# Patient Record
Sex: Male | Born: 1975
Health system: Southern US, Community
[De-identification: ages and names within clinical notes are randomized; demographics above are authoritative.]

## PROBLEM LIST (undated history)

## (undated) DIAGNOSIS — I4892 Unspecified atrial flutter: Secondary | ICD-10-CM

## (undated) DIAGNOSIS — L0390 Cellulitis, unspecified: Secondary | ICD-10-CM

## (undated) DIAGNOSIS — I4891 Unspecified atrial fibrillation: Secondary | ICD-10-CM

## (undated) HISTORY — PX: OTHER SURGICAL HISTORY: SHX169

---

## 2019-06-18 ENCOUNTER — Other Ambulatory Visit: Payer: Self-pay

## 2019-06-18 ENCOUNTER — Telehealth (HOSPITAL_COMMUNITY): Payer: Self-pay | Admitting: *Deleted

## 2019-06-18 ENCOUNTER — Emergency Department (HOSPITAL_COMMUNITY)
Admission: EM | Admit: 2019-06-18 | Discharge: 2019-06-18 | Disposition: A | Discharge status: Home / Self Care | Payer: 59 | Attending: Emergency Medicine | Admitting: Emergency Medicine

## 2019-06-18 ENCOUNTER — Encounter (HOSPITAL_COMMUNITY): Payer: Self-pay | Admitting: Emergency Medicine

## 2019-06-18 DIAGNOSIS — F17210 Nicotine dependence, cigarettes, uncomplicated: Secondary | ICD-10-CM | POA: Insufficient documentation

## 2019-06-18 DIAGNOSIS — I483 Typical atrial flutter: Secondary | ICD-10-CM | POA: Diagnosis not present

## 2019-06-18 DIAGNOSIS — R002 Palpitations: Secondary | ICD-10-CM | POA: Diagnosis present

## 2019-06-18 HISTORY — DX: Unspecified atrial flutter: I48.92

## 2019-06-18 LAB — BASIC METABOLIC PANEL
Anion gap: 9 (ref 5–15)
BUN: 17 mg/dL (ref 6–20)
CO2: 22 mmol/L (ref 22–32)
Calcium: 9.2 mg/dL (ref 8.9–10.3)
Chloride: 106 mmol/L (ref 98–111)
Creatinine, Ser: 0.91 mg/dL (ref 0.61–1.24)
GFR calc Af Amer: >60 mL/min (ref >60)
GFR calc non Af Amer: >60 mL/min (ref >60)
Glucose, Bld: 107 mg/dL — ABNORMAL HIGH (ref 70–99)
Potassium: 4 mmol/L (ref 3.5–5.1)
Sodium: 137 mmol/L (ref 135–145)

## 2019-06-18 LAB — CBC
HCT: 41.9 % (ref 39.0–52.0)
Hemoglobin: 14.2 g/dL (ref 13.0–17.0)
MCH: 33.2 pg (ref 26.0–34.0)
MCHC: 33.9 g/dL (ref 30.0–36.0)
MCV: 97.9 fL (ref 80.0–100.0)
Platelets: 144 10*3/uL — ABNORMAL LOW (ref 150–400)
RBC: 4.28 MIL/uL (ref 4.22–5.81)
RDW: 13.2 % (ref 11.5–15.5)
WBC: 6.4 10*3/uL (ref 4.0–10.5)
nRBC: 0 % (ref 0.0–0.2)

## 2019-06-18 LAB — MAGNESIUM: Magnesium: 1.9 mg/dL (ref 1.7–2.4)

## 2019-06-18 MED ORDER — APIXABAN 5 MG PO TABS: 5.0000 mg | ORAL_TABLET | Freq: Two times a day (BID) | ORAL | 0 refills | Status: DC

## 2019-06-18 MED ORDER — PROPOFOL 10 MG/ML IV BOLUS
INTRAVENOUS | Status: AC | PRN
  Administered 2019-06-18 (×2): 50 mg via INTRAVENOUS

## 2019-06-18 MED ORDER — PROPOFOL 10 MG/ML IV BOLUS
50.0000 mg | Freq: Once | INTRAVENOUS | Status: DC
  Filled 2019-06-18: qty 20

## 2019-06-18 MED ORDER — APIXABAN 5 MG PO TABS
5.0000 mg | ORAL_TABLET | Freq: Two times a day (BID) | ORAL | Status: DC
  Administered 2019-06-18: 5 mg via ORAL
  Filled 2019-06-18: qty 1

## 2019-06-18 MED FILL — ELIQUIS 5 MG TABLET: 5 | 30 days supply | Qty: 60 | Fill #0

## 2019-06-18 NOTE — ED Notes (Signed)
Patient placed on zoll pads, suction and ambu bag set up at bedside, airway cart placed outside of room. RRT at bedside in preparation for cardioversion with conscious sedation

## 2019-06-18 NOTE — ED Triage Notes (Addendum)
Patient reports "fluttering" in his chest and sob that began while he was at work yesterday. Hx of a flutter and was recently taken off of all his meds when he moved here a few months ago. Pt denies chest pain, resp e/u, skin warm and dry, nad. Pt denies cough, fever, chills, body aches, sore throat or recent sick contacts.

## 2019-06-18 NOTE — Telephone Encounter (Signed)
LMOM for pt to [facility].  Pt dccv in ED and referred to our clinic.

## 2019-06-18 NOTE — ED Provider Notes (Signed)
[name] Provider Note   CSN: [id number] Arrival date & time: 06/18/19  0555     History   Chief Complaint Chief Complaint  Patient presents with  . Palpitations  . Shortness of Breath    HPI [name] is a 43 y.o. male.     The history is provided by the patient.  Palpitations Palpitations quality:  Irregular Onset quality:  Sudden Duration:  18 hours Timing:  Constant Progression:  Unchanged Chronicity:  New Relieved by:  None tried Worsened by:  Nothing Associated symptoms: shortness of breath   Associated symptoms: no chest pain, no syncope and no vomiting   Shortness of Breath Associated symptoms: no chest pain, no fever, no syncope and no vomiting    Patient with history of atrial flutter presents with palpitations.  He reports in the past 18 hours he had onset of palpitations.  He was at work when this started.  He reports mild shortness of breath.  No chest pain.  No syncope.  Patient reports when he lived in [place] he had similar episode and required cardioversion.  He has been on amiodarone and Eliquis previously, but is not currently on these medicines.  He is established with a cardiologist in [place], but has not been placed back on those medications .  No bleeding issues when taking Eliquis previously Past Medical History:  Diagnosis Date  . Atrial flutter (HCC)     There are no active problems to display for this patient.   Past Surgical History:  Procedure Laterality Date  . arm surgery Left         Home Medications    Prior to Admission medications   Not on File    Family History No family history on file.  Social History Social History   Tobacco Use  . Smoking status: Current Every Day Smoker    Packs/day: 0.50    Types: Cigarettes  Substance Use Topics  . Alcohol use: Not Currently    Frequency: Never  . Drug use: Never     Allergies   Patient has no known allergies.    Review of Systems Review of Systems  Constitutional: Negative for fever.  Respiratory: Positive for shortness of breath.   Cardiovascular: Positive for palpitations. Negative for chest pain and syncope.  Gastrointestinal: Negative for blood in stool and vomiting.  Neurological: Negative for syncope.  All other systems reviewed and are negative.    Physical Exam Updated Vital Signs BP 115/79   Pulse 69   Temp 97.7 F (36.5 C) (Oral)   Resp 15   SpO2 99%   Physical Exam CONSTITUTIONAL: Well developed/well nourished, no distress HEAD: Normocephalic/atraumatic EYES: EOMI/PERRL ENMT: Mucous membranes moist, uvula midline NECK: supple no meningeal signs SPINE/BACK:entire spine nontender CV: irregular, no loud murmurs LUNGS: Lungs are clear to auscultation bilaterally, no apparent distress ABDOMEN: soft, nontender, no rebound or guarding, bowel sounds noted throughout abdomen GU:no cva tenderness NEURO: Pt is awake/alert/appropriate, moves all extremitiesx4.  No facial droop.   EXTREMITIES: pulses normal/equal, full ROM SKIN: warm, color normal PSYCH: no abnormalities of mood noted, alert and oriented to situation   ED Treatments / Results  Labs (all labs ordered are listed, but only abnormal results are displayed) Labs Reviewed  CBC - Abnormal; Notable for the following components:      Result Value   Platelets 144 (*)    All other components within normal limits  BASIC METABOLIC PANEL  MAGNESIUM  EKG EKG Interpretation  Date/Time:  Friday June 18 2019 06:07:47 EDT Ventricular Rate:  73 PR Interval:    QRS Duration: 99 QT Interval:  460 QTC Calculation: 486 R Axis:   89 Text Interpretation:  Atrial flutter Interpretation limited secondary to artifact No previous ECGs available Confirmed by Ripley Fraise [phone]) on 06/18/2019 6:10:34 AM   Radiology No results found.  Procedures .Sedation  Date/Time: 06/18/2019 6:35 AM Performed by: Ripley Fraise, MD Authorized by: Ripley Fraise, MD   Consent:    Consent obtained:  Written   Consent given by:  Patient Universal protocol:    Immediately prior to procedure a time out was called: yes     Patient identity confirmation method:  Arm band, provided demographic data and verbally with patient Indications:    Procedure performed:  Cardioversion Pre-sedation assessment:    Time since last food or drink:  8 hours   ASA classification: class 2 - patient with mild systemic disease     Neck mobility: normal     Mallampati score:  I - soft palate, uvula, fauces, pillars visible   Pre-sedation assessments completed and reviewed: airway patency, cardiovascular function and mental status     Pre-sedation assessment completed:  06/18/2019 6:35 AM Immediate pre-procedure details:    Reassessment: Patient reassessed immediately prior to procedure     Reviewed: vital signs, relevant labs/tests and NPO status     Verified: bag valve mask available, emergency equipment available, IV patency confirmed and oxygen available   Procedure details (see MAR for exact dosages):    Preoxygenation:  Nasal cannula   Sedation:  Propofol   Intra-procedure monitoring:  Blood pressure monitoring, cardiac monitor, continuous capnometry, continuous pulse oximetry, frequent LOC assessments and frequent vital sign checks   Intra-procedure events: none     Total Provider sedation time (minutes):  18 Post-procedure details:    Post-sedation assessment completed:  06/18/2019 7:04 AM   Attendance: Constant attendance by certified staff until patient recovered     Recovery: Patient returned to pre-procedure baseline     Post-sedation assessments completed and reviewed: airway patency and cardiovascular function     Patient is stable for discharge or admission: yes     Patient tolerance:  Tolerated well, no immediate complications .Cardioversion  Date/Time: 06/18/2019 6:36 AM Performed by: Ripley Fraise, MD  Authorized by: Ripley Fraise, MD   Consent:    Consent given by:  Patient   Risks discussed:  Induced arrhythmia and pain   Alternatives discussed:  No treatment and anti-coagulation medication Pre-procedure details:    Cardioversion basis:  Emergent   Rhythm:  Atrial flutter   Electrode placement:  Anterior-posterior Patient sedated: Yes. Refer to sedation procedure documentation for details of sedation.  Attempt one:    Cardioversion mode:  Synchronous   Shock (Joules):  100   Shock outcome:  Conversion to normal sinus rhythm Post-procedure details:    Patient status:  Awake   Patient tolerance of procedure:  Tolerated well, no immediate complications    Medications Ordered in ED Medications  apixaban (ELIQUIS) tablet 5 mg (has no administration in time range)  propofol (DIPRIVAN) 10 mg/mL bolus/IV push 50 mg ( Intravenous See Procedure Record 06/18/19 0642)  propofol (DIPRIVAN) 10 mg/mL bolus/IV push (50 mg Intravenous Given 06/18/19 0932)     Initial Impression / Assessment and Plan / ED Course  I have reviewed the triage vital signs and the nursing notes.  Pertinent labs  results that were available during my  care of the patient were reviewed by me and considered in my medical decision making (see chart for details).        6:36 AM Patient with previous history of atrial flutter presents with recurrent episode.  He is no longer on medications.  After discussion of risk and benefit, patient agrees to have cardioversion. Patient was n.p.o., onset of a-flutter was less than 24 hours  6:53 AM Post cardioverson EKG :   EKG Interpretation  Date/Time:  Friday June 18 2019 06:43:57 EDT Ventricular Rate:  60 PR Interval:    QRS Duration: 97 QT Interval:  443 QTC Calculation: 443 R Axis:   82 Text Interpretation:  Sinus rhythm Sinus pause Low voltage, precordial leads Confirmed by Zadie RhineWickline, Alby Schwabe ([id number]) on 06/18/2019 6:53:21 AM       7:07 AM Pt improving He  is awake/alert, no focal arm/leg drift He is back in sinus rhythm He will need to see pharmacy prior to discharge At signout to dr lockwood, f/u on labs, reassess and d/c home   This patients CHA2DS2-VASc Score and unadjusted Ischemic Stroke Rate (% per year) is equal to 0.2 % stroke rate/year from a score of 0  Above score calculated as 1 point each if present [CHF, HTN, DM, Vascular=MI/PAD/Aortic Plaque, Age if 65-74, or Male] Above score calculated as 2 points each if present [Age > 75, or Stroke/TIA/TE]    Final Clinical Impressions(s) / ED Diagnoses   Final diagnoses:  Typical atrial flutter [facility](HCC)    ED Discharge Orders         Ordered    Amb referral to AFIB Clinic     06/18/19 0620    apixaban (ELIQUIS) 5 MG TABS tablet  2 times daily     06/18/19 [id number]           Zadie RhineWickline, Kashonda Sarkisyan, MD 06/18/19 [phone]

## 2019-06-18 NOTE — Sedation Documentation (Signed)
Consent for sedation and cardioversion signed by patient, this RN and Dr Christy Gentles after procedure explained to patient by MD and all questions answered.

## 2019-06-18 NOTE — Sedation Documentation (Signed)
Patient cardioverted at [place], patient heart rhythm returned to NSR

## 2019-06-18 NOTE — ED Notes (Signed)
Pharmacy at bedside educating patient on eliquis.

## 2019-06-18 NOTE — ED Notes (Signed)
ED Provider at bedside. 

## 2019-10-03 ENCOUNTER — Emergency Department (HOSPITAL_COMMUNITY)
Admission: EM | Admit: 2019-10-03 | Discharge: 2019-10-03 | Disposition: A | Discharge status: Home / Self Care | Payer: Self-pay | Attending: Emergency Medicine | Admitting: Emergency Medicine

## 2019-10-03 ENCOUNTER — Emergency Department (HOSPITAL_COMMUNITY): Payer: Self-pay

## 2019-10-03 ENCOUNTER — Other Ambulatory Visit: Payer: Self-pay

## 2019-10-03 ENCOUNTER — Encounter (HOSPITAL_COMMUNITY): Payer: Self-pay | Admitting: Emergency Medicine

## 2019-10-03 DIAGNOSIS — Z79899 Other long term (current) drug therapy: Secondary | ICD-10-CM | POA: Insufficient documentation

## 2019-10-03 DIAGNOSIS — I4892 Unspecified atrial flutter: Secondary | ICD-10-CM | POA: Insufficient documentation

## 2019-10-03 DIAGNOSIS — Z7982 Long term (current) use of aspirin: Secondary | ICD-10-CM | POA: Insufficient documentation

## 2019-10-03 DIAGNOSIS — F17210 Nicotine dependence, cigarettes, uncomplicated: Secondary | ICD-10-CM | POA: Insufficient documentation

## 2019-10-03 LAB — BASIC METABOLIC PANEL
Anion gap: 8 (ref 5–15)
BUN: 19 mg/dL (ref 6–20)
CO2: 22 mmol/L (ref 22–32)
Calcium: 9.4 mg/dL (ref 8.9–10.3)
Chloride: 107 mmol/L (ref 98–111)
Creatinine, Ser: 1 mg/dL (ref 0.61–1.24)
GFR calc Af Amer: >60 mL/min (ref >60)
GFR calc non Af Amer: >60 mL/min (ref >60)
Glucose, Bld: 91 mg/dL (ref 70–99)
Potassium: 4 mmol/L (ref 3.5–5.1)
Sodium: 137 mmol/L (ref 135–145)

## 2019-10-03 LAB — CBC
HCT: 41.4 % (ref 39.0–52.0)
Hemoglobin: 14.5 g/dL (ref 13.0–17.0)
MCH: 34.2 pg — ABNORMAL HIGH (ref 26.0–34.0)
MCHC: 35 g/dL (ref 30.0–36.0)
MCV: 97.6 fL (ref 80.0–100.0)
Platelets: 170 10*3/uL (ref 150–400)
RBC: 4.24 MIL/uL (ref 4.22–5.81)
RDW: 12.5 % (ref 11.5–15.5)
WBC: 7 10*3/uL (ref 4.0–10.5)
nRBC: 0 % (ref 0.0–0.2)

## 2019-10-03 LAB — TROPONIN I (HIGH SENSITIVITY)
Troponin I (High Sensitivity): 5 ng/L (ref <18)
Troponin I (High Sensitivity): 6 ng/L (ref <18)

## 2019-10-03 MED ORDER — PROPOFOL 10 MG/ML IV BOLUS
0.7500 mg/kg | Freq: Once | INTRAVENOUS | Status: DC
  Filled 2019-10-03: qty 20

## 2019-10-03 MED ORDER — SODIUM CHLORIDE 0.9% FLUSH: 3.0000 mL | Freq: Once | INTRAVENOUS | Status: DC

## 2019-10-03 MED ORDER — PROPOFOL 10 MG/ML IV BOLUS
INTRAVENOUS | Status: DC | PRN
  Administered 2019-10-03: 107.9 mg via INTRAVENOUS

## 2019-10-03 NOTE — ED Triage Notes (Signed)
Pt states he has felt his heart fluttering since this morning. He has discomfort when it flutters. No pain at present.

## 2019-10-03 NOTE — ED Notes (Signed)
Patient verbalizes understanding of discharge instructions. Opportunity for questioning and answers were provided. Armband removed by staff, pt discharged from ED.  

## 2019-10-03 NOTE — ED Provider Notes (Signed)
[facility] EMERGENCY DEPARTMENT Provider Note   CSN: [id number] Arrival date & time: 10/03/19  1238     History   Chief Complaint Chief Complaint  Patient presents with  . Palpitations    HPI [name] is a 43 y.o. male with a hx of tobacco use & paroxysmal atrial flutter who presents to the ED w/ complaints of palpitations that began @ 0500 this AM. Patient states he feels his heart is beating abnormally, initially felt fast but has slowed down with associated mild trouble breathing. Sxs have been constant, seem to be somewhat improving with slowing down but not resolved. No alleviating/aggravating factors. No intervention PTA. States sxs feel similar to prior atrial flutter. Has had this happen previously & underwent two prior cardioversions most recently in June of this year. Was placed on Eliquis at one point but had been unable to fill further medications.  Denies chest pain, dizziness, syncope,  leg pain/swelling, hemoptysis, recent surgery/trauma, recent long travel, hormone use, personal hx of cancer, or hx of DVT/PE. He currently takes a baby aspirin.   HPI  Past Medical History:  Diagnosis Date  . Atrial flutter (HCC)     There are no active problems to display for this patient.   Past Surgical History:  Procedure Laterality Date  . arm surgery Left         Home Medications    Prior to Admission medications   Medication Sig Start Date End Date Taking? Authorizing Provider  acetaminophen (TYLENOL) 500 MG tablet Take 1,000 mg by mouth every 6 (six) hours as needed for headache (pain).   Yes [provider]  aspirin EC 81 MG tablet Take 81 mg by mouth every morning.    Yes [provider]  Multiple Vitamin (MULTIVITAMIN WITH MINERALS) TABS tablet Take 1 tablet by mouth every morning.   Yes [provider]  vitamin B-12 (CYANOCOBALAMIN) 1000 MCG tablet Take 1,000 mcg by mouth every morning.   Yes [provider]  apixaban (ELIQUIS) 5 MG TABS tablet Take 1 tablet (5 mg total) by mouth 2 (two) times daily for 30 days. Patient not taking: Reported on 10/03/2019 06/18/19 07/18/19  Gerhard Munch, MD    Family History No family history on file.  Social History Social History   Tobacco Use  . Smoking status: Current Every Day Smoker    Packs/day: 0.50    Types: Cigarettes  Substance Use Topics  . Alcohol use: Not Currently    Frequency: Never  . Drug use: Never     Allergies   Patient has no known allergies.   Review of Systems Review of Systems  Constitutional: Negative for chills, diaphoresis and fever.  Respiratory: Positive for shortness of breath.   Cardiovascular: Positive for palpitations. Negative for chest pain and leg swelling.  Gastrointestinal: Negative for abdominal pain, nausea and vomiting.  Neurological: Negative for dizziness, syncope and weakness.  All other systems reviewed and are negative.    Physical Exam Updated Vital Signs BP 118/75   Pulse 74   Temp 98.5 F (36.9 C) (Oral)   Resp 14   SpO2 99%   Physical Exam Vitals signs and nursing note reviewed.  Constitutional:      General: He is not in acute distress.    Appearance: He is well-developed. He is not toxic-appearing.  HENT:     Head: Normocephalic and atraumatic.  Eyes:     General:        Right  eye: No discharge.        Left eye: No discharge.     Conjunctiva/sclera: Conjunctivae normal.  Neck:     Musculoskeletal: Neck supple.  Cardiovascular:     Rate and Rhythm: Normal rate. Rhythm irregular.     Comments: 2+ symmetric radial pulses.  Pulmonary:     Effort: Pulmonary effort is normal. No respiratory distress.     Breath sounds: Normal breath sounds. No wheezing, rhonchi or rales.  Abdominal:     General: There is no distension.     Palpations: Abdomen is soft.     Tenderness: There is no abdominal tenderness.  Skin:    General: Skin is warm and dry.     Findings:  No rash.  Neurological:     Mental Status: He is alert.     Comments: Clear speech.   Psychiatric:        Behavior: Behavior normal.    ED Treatments / Results  Labs (all labs ordered are listed, but only abnormal results are displayed) Labs Reviewed  CBC - Abnormal; Notable for the following components:      Result Value   MCH 34.2 (*)    All other components within normal limits  BASIC METABOLIC PANEL  TROPONIN I (HIGH SENSITIVITY)  TROPONIN I (HIGH SENSITIVITY)    EKG  EKG Interpretation  Date/Time:  Sunday October 03 2019 13:22:18 EDT Ventricular Rate:  95 PR Interval:    QRS Duration: 88 QT Interval:  360 QTC Calculation: 452 R Axis:   89 Text Interpretation:  Atrial flutter with variable A-V block Abnormal ECG No STEMI  Confirmed by Alvester Chourifan, Matthew [phone]) on 10/03/2019 2:52:10 PM  EKG Interpretation  Date/Time:  Sunday October 03 2019 17:01:19 EDT Ventricular Rate:  64 PR Interval:    QRS Duration: 97 QT Interval:  407 QTC Calculation: 420 R Axis:   75 Text Interpretation:  Sinus rhythm No STEMI  Confirmed by Alvester Chourifan, Matthew [phone]([id number]) on 10/03/2019 5:34:47 PM  Radiology Dg Chest 2 View  Result Date: 10/03/2019 CLINICAL DATA:  Chest pain and shortness-of-breath. EXAM: CHEST - 2 VIEW COMPARISON:  09/09/2019 FINDINGS: Lungs are adequately inflated without airspace consolidation or effusion. Cardiomediastinal silhouette and remainder the exam is unchanged IMPRESSION: No active cardiopulmonary disease. Electronically Signed   By: Elberta Fortisaniel  Boyle M.D.   On: 10/03/2019 14:04    Procedures .Critical Care Performed by: Cherly AndersonPetrucelli, Rei Medlen R, PA-C Authorized by: Cherly AndersonPetrucelli, Karlis Cregg R, PA-C    CRITICAL CARE Performed by: Harvie HeckSamantha Jomarie Gellis   Total critical care time: 30 minutes  Critical care time was exclusive of separately billable procedures and treating other patients.  Critical care was necessary to treat or prevent imminent or life-threatening  deterioration.  Critical care was time spent personally by me on the following activities: development of treatment plan with patient and/or surrogate as well as nursing, discussions with consultants, evaluation of patient's response to treatment, examination of patient, obtaining history from patient or surrogate, ordering and performing treatments and interventions, ordering and review of laboratory studies, ordering and review of radiographic studies, pulse oximetry and re-evaluation of patient's condition.   (including critical care time)  Medications Ordered in ED Medications  propofol (DIPRIVAN) 10 mg/mL bolus/IV push 107.9 mg ( Intravenous See Procedure Record 10/03/19 1700)  propofol (DIPRIVAN) 10 mg/mL bolus/IV push (107.9 mg Intravenous Given 10/03/19 1700)     Initial Impression / Assessment and Plan / ED Course  I have reviewed the triage vital signs and the nursing notes.  Pertinent labs & imaging results that were available during my care of the patient were reviewed by me and considered in my medical decision making (see chart for details).  Patient presents to the emergency department with complaints of palpitations with mild shortness of breath that began at 5 AM this morning.  Nontoxic-appearing, resting comfortably, vitals WNL.  EKG consistent with atrial flutter without RVR.  History of same.  Known time of onset > 24 hours.  Patient is a candidate for cardioversion which he has had successfully previously- discussed risks/benefits- patient agreeable to proceed.   Work-up per triage team:  CBC: No anemia/leukocytosis.  BMP: No electrolyte derangement.  Troponin: 5, 6- no significant elevation or delta elevation.  EKG: Atrial flutter with variable A-V block  CXR: No active cardiopulmonary disease.  NSR s/p cardioversion under procedural sedation w/ supervising physician Dr. Langston Masker- please see his note for further details.   CHA2DS2VASC score 0 therefore will not place on  anticoagulation.  He currently takes baby aspirin daily.   Patient awake, alert, oriented appropriate s/p cardioversion, remains in NSR, he is requesting to go home. Pressures a bit soft but similar to prior visits. Will discharge home with ambulatory referral to afib clinic. I discussed results, treatment plan, need for follow-up, and return precautions with the patient. Provided opportunity for questions, patient confirmed understanding and is in agreement with plan.   Findings and plan of care discussed with supervising physician Dr. Langston Masker who has evaluated patient & is in agreement.   Final Clinical Impressions(s) / ED Diagnoses   Final diagnoses:  Atrial flutter with rapid ventricular response [facility])    ED Discharge Orders         Ordered    Amb referral to AFIB Clinic     10/03/19 1742           Amaryllis Dyke, PA-C 10/03/19 1818    Wyvonnia Dusky, MD 10/03/19 [phone]

## 2019-10-03 NOTE — Discharge Instructions (Signed)
You were seen in the emergency department today for atrial flutter.  You were cardioverted/shocked back into a normal rhythm.  We would like you to follow-up with our atrial fibrillation clinic within 1 week, we have placed a referral.  Return to the ER for new or worsening symptoms including but not limited to return of palpitations, trouble breathing, chest pain, passing out, or any other concerns.

## 2019-10-03 NOTE — ED Provider Notes (Signed)
.Sedation  Date/Time: 10/03/2019 5:27 PM Performed by: Terald Sleeper, MD Authorized by: Terald Sleeper, MD   Consent:    Consent obtained:  Verbal   Consent given by:  Patient   Risks discussed:  Allergic reaction, dysrhythmia, inadequate sedation, nausea, prolonged hypoxia resulting in organ damage, prolonged sedation necessitating reversal, respiratory compromise necessitating ventilatory assistance and intubation and vomiting   Alternatives discussed:  Analgesia without sedation, anxiolysis and regional anesthesia Universal protocol:    Procedure explained and questions answered to patient or proxy's satisfaction: yes     Relevant documents present and verified: yes     Test results available and properly labeled: yes     Imaging studies available: yes     Required blood products, implants, devices, and special equipment available: yes     Site/side marked: yes     Immediately prior to procedure a time out was called: yes     Patient identity confirmation method:  Verbally with patient Indications:    Procedure necessitating sedation performed by:  Physician performing sedation Pre-sedation assessment:    Time since last food or drink:  8 hours   ASA classification: class 2 - patient with mild systemic disease     Neck mobility: normal     Mouth opening:  2 finger widths   Thyromental distance:  4 finger widths   Mallampati score:  II - soft palate, uvula, fauces visible   Pre-sedation assessments completed and reviewed: airway patency, cardiovascular function, hydration status, mental status, nausea/vomiting, pain level, respiratory function and temperature   Immediate pre-procedure details:    Reassessment: Patient reassessed immediately prior to procedure     Reviewed: vital signs, relevant labs/tests and NPO status     Verified: bag valve mask available, emergency equipment available, intubation equipment available, IV patency confirmed, oxygen available and suction  available   Procedure details (see MAR for exact dosages):    Preoxygenation:  Nasal cannula   Sedation:  Propofol   Intended level of sedation: deep   Intra-procedure monitoring:  Blood pressure monitoring, cardiac monitor, continuous pulse oximetry, frequent LOC assessments, frequent vital sign checks and continuous capnometry   Intra-procedure events: none     Total Provider sedation time (minutes):  30 Post-procedure details:    Attendance: Constant attendance by certified staff until patient recovered     Recovery: Patient returned to pre-procedure baseline     Post-sedation assessments completed and reviewed: airway patency, cardiovascular function, hydration status, mental status, nausea/vomiting, pain level, respiratory function and temperature     Patient is stable for discharge or admission: yes     Patient tolerance:  Tolerated well, no immediate complications .Cardioversion  Date/Time: 10/03/2019 5:28 PM Performed by: Terald Sleeper, MD Authorized by: Terald Sleeper, MD   Consent:    Consent obtained:  Written   Consent given by:  Patient   Risks discussed:  Induced arrhythmia and pain   Alternatives discussed:  No treatment Pre-procedure details:    Cardioversion basis:  Elective   Rhythm:  Atrial flutter   Electrode placement:  Anterior-posterior Patient sedated: Yes. Refer to sedation procedure documentation for details of sedation.  Attempt one:    Cardioversion mode:  Synchronous   Waveform:  Biphasic   Shock (Joules):  150   Shock outcome:  Conversion to normal sinus rhythm Post-procedure details:    Patient status:  Awake   Patient tolerance of procedure:  Tolerated well, no immediate complications .Critical Care Performed by: Terald Sleeper,  MD Authorized by: Wyvonnia Dusky, MD   Critical care provider statement:    Critical care time (minutes):  30   Critical care was necessary to treat or prevent imminent or life-threatening deterioration  of the following conditions: Arrythmias.   Critical care was time spent personally by me on the following activities:  Discussions with consultants, evaluation of patient's response to treatment, examination of patient, ordering and performing treatments and interventions, ordering and review of laboratory studies, ordering and review of radiographic studies, pulse oximetry, re-evaluation of patient's condition, obtaining history from patient or surrogate and review of old charts      Leonda Cristo, Carola Rhine, MD 10/03/19 1729

## 2019-11-30 ENCOUNTER — Other Ambulatory Visit: Payer: Self-pay

## 2019-11-30 ENCOUNTER — Emergency Department (HOSPITAL_COMMUNITY)
Admission: EM | Admit: 2019-11-30 | Discharge: 2019-11-30 | Disposition: A | Discharge status: Home / Self Care | Payer: Self-pay | Attending: Emergency Medicine | Admitting: Emergency Medicine

## 2019-11-30 ENCOUNTER — Emergency Department (HOSPITAL_COMMUNITY): Payer: Self-pay

## 2019-11-30 ENCOUNTER — Encounter (HOSPITAL_COMMUNITY): Payer: Self-pay | Admitting: Emergency Medicine

## 2019-11-30 DIAGNOSIS — Z7982 Long term (current) use of aspirin: Secondary | ICD-10-CM | POA: Insufficient documentation

## 2019-11-30 DIAGNOSIS — Z79899 Other long term (current) drug therapy: Secondary | ICD-10-CM | POA: Insufficient documentation

## 2019-11-30 DIAGNOSIS — F17210 Nicotine dependence, cigarettes, uncomplicated: Secondary | ICD-10-CM | POA: Insufficient documentation

## 2019-11-30 DIAGNOSIS — I4892 Unspecified atrial flutter: Secondary | ICD-10-CM | POA: Insufficient documentation

## 2019-11-30 LAB — BASIC METABOLIC PANEL
Anion gap: 11 (ref 5–15)
BUN: 21 mg/dL — ABNORMAL HIGH (ref 6–20)
CO2: 28 mmol/L (ref 22–32)
Calcium: 9.4 mg/dL (ref 8.9–10.3)
Chloride: 101 mmol/L (ref 98–111)
Creatinine, Ser: 1.09 mg/dL (ref 0.61–1.24)
GFR calc Af Amer: >60 mL/min (ref >60)
GFR calc non Af Amer: >60 mL/min (ref >60)
Glucose, Bld: 101 mg/dL — ABNORMAL HIGH (ref 70–99)
Potassium: 4.2 mmol/L (ref 3.5–5.1)
Sodium: 140 mmol/L (ref 135–145)

## 2019-11-30 LAB — TROPONIN I (HIGH SENSITIVITY)
Troponin I (High Sensitivity): 5 ng/L (ref <18)
Troponin I (High Sensitivity): 5 ng/L (ref <18)

## 2019-11-30 LAB — CBC
HCT: 38.3 % — ABNORMAL LOW (ref 39.0–52.0)
Hemoglobin: 13.3 g/dL (ref 13.0–17.0)
MCH: 33.6 pg (ref 26.0–34.0)
MCHC: 34.7 g/dL (ref 30.0–36.0)
MCV: 96.7 fL (ref 80.0–100.0)
Platelets: 156 10*3/uL (ref 150–400)
RBC: 3.96 MIL/uL — ABNORMAL LOW (ref 4.22–5.81)
RDW: 12.4 % (ref 11.5–15.5)
WBC: 6.9 10*3/uL (ref 4.0–10.5)
nRBC: 0 % (ref 0.0–0.2)

## 2019-11-30 MED ORDER — PROPOFOL 10 MG/ML IV BOLUS
0.5000 mg/kg | Freq: Once | INTRAVENOUS | Status: AC
  Administered 2019-11-30: 100 mg via INTRAVENOUS
  Filled 2019-11-30: qty 20

## 2019-11-30 MED ORDER — SODIUM CHLORIDE 0.9% FLUSH: 3.0000 mL | Freq: Once | INTRAVENOUS | Status: DC

## 2019-11-30 MED ORDER — SODIUM CHLORIDE 0.9 % IV BOLUS
1000.0000 mL | Freq: Once | INTRAVENOUS | Status: AC
  Administered 2019-11-30: 1000 mL via INTRAVENOUS

## 2019-11-30 MED ORDER — RIVAROXABAN 20 MG PO TABS
20.0000 mg | ORAL_TABLET | Freq: Once | ORAL | Status: AC
  Administered 2019-11-30: 20 mg via ORAL
  Filled 2019-11-30: qty 1

## 2019-11-30 MED ORDER — RIVAROXABAN 20 MG PO TABS: 20.0000 mg | ORAL_TABLET | Freq: Every day | ORAL | 0 refills | Status: DC

## 2019-11-30 MED ORDER — RIVAROXABAN 20 MG PO TABS: 20.0000 mg | ORAL_TABLET | Freq: Every day | ORAL | Status: DC

## 2019-11-30 NOTE — Sedation Documentation (Signed)
Pt cardioverted at 200J, pt appears to be in NSR immediately after

## 2019-11-30 NOTE — ED Notes (Signed)
Brought back into triage for re-eval. Pt denies cp or sob. States - "I'm just tired." Explained poc and wait time. Pt understanding.

## 2019-11-30 NOTE — ED Triage Notes (Signed)
Pt reports "I can't get my heart rate down."  EMS checked him out earlier in the night, he declined to come to ED, when it happened again, he came to the hospital.  Pt reports he gets very SOB when his heart rate goes up.

## 2019-11-30 NOTE — Discharge Instructions (Addendum)
Stop the baby aspirin while you are on the Xarelto.  After the month of taking it you can go back to the baby aspirin.

## 2019-11-30 NOTE — ED Notes (Signed)
Pt d/c home per MD order. Discharge summary reviewed with pt, pt verbalizes understanding. Reports discharge ride home. Ambulatory off unit. No s/s of acute distress noted.  

## 2019-11-30 NOTE — ED Provider Notes (Signed)
[facility] EMERGENCY DEPARTMENT Provider Note   CSN: [id number] Arrival date & time: 11/30/19  0147     History   Chief Complaint Chief Complaint  Patient presents with  . Tachycardia  . Shortness of Breath    HPI Russell [name] is a 43 y.o. male.     HPI  43 year old male presents with recurrent atrial flutter. Last night at dinner (~730 pm) he noticed palpitations and fast heart rate. Says his heart rate was up to 200. Has slowed down while waiting in waiting room, but still feels palpitations. Transiently had shortness of breath, but that is gone. No chest pain. Feels like prior atrial flutter. He's had multiple cardioversions for this. Has not follow up with afib clinic here due to lack of insurance, though he should get some from his job starting in January.  Past Medical History:  Diagnosis Date  . Atrial flutter (HCC)     There are no active problems to display for this patient.   Past Surgical History:  Procedure Laterality Date  . arm surgery Left         Home Medications    Prior to Admission medications   Medication Sig Start Date End Date Taking? Authorizing Provider  acetaminophen (TYLENOL) 500 MG tablet Take 1,000 mg by mouth every 6 (six) hours as needed for headache (pain).    [provider]  apixaban (ELIQUIS) 5 MG TABS tablet Take 1 tablet (5 mg total) by mouth 2 (two) times daily for 30 days. Patient not taking: Reported on 10/03/2019 06/18/19 07/18/19  Gerhard Munch, MD  aspirin EC 81 MG tablet Take 81 mg by mouth every morning.     [provider]  Multiple Vitamin (MULTIVITAMIN WITH MINERALS) TABS tablet Take 1 tablet by mouth every morning.    [provider]  rivaroxaban (XARELTO) 20 MG TABS tablet Take 1 tablet (20 mg total) by mouth daily with supper. 11/30/19   Pricilla Loveless, MD  vitamin B-12 (CYANOCOBALAMIN) 1000 MCG tablet Take 1,000 mcg by mouth every morning.    [provider]     Family History No family history on file.  Social History Social History   Tobacco Use  . Smoking status: Current Every Day Smoker    Packs/day: 0.50    Types: Cigarettes  Substance Use Topics  . Alcohol use: Not Currently    Frequency: Never  . Drug use: Never     Allergies   Patient has no known allergies.   Review of Systems Review of Systems  Constitutional: Negative for fever.  Respiratory: Positive for shortness of breath.   Cardiovascular: Positive for palpitations. Negative for chest pain.  All other systems reviewed and are negative.    Physical Exam Updated Vital Signs BP 124/83   Pulse 67   Temp 98.2 F (36.8 C) (Oral)   Resp (!) 21   Ht 5\' 10"  (1.778 m)   Wt (!) 149.7 kg   SpO2 100%   BMI 47.35 kg/m   Physical Exam Vitals signs and nursing note reviewed.  Constitutional:      General: He is not in acute distress.    Appearance: He is well-developed. He is obese. He is not ill-appearing or diaphoretic.  HENT:     Head: Normocephalic and atraumatic.     Right Ear: External ear normal.     Left Ear: External ear normal.     Nose: Nose normal.  Eyes:  General:        Right eye: No discharge.        Left eye: No discharge.  Neck:     Musculoskeletal: Neck supple.  Cardiovascular:     Rate and Rhythm: Normal rate. Rhythm irregular.     Heart sounds: Normal heart sounds.  Pulmonary:     Effort: Pulmonary effort is normal.     Breath sounds: Normal breath sounds.  Abdominal:     Palpations: Abdomen is soft.     Tenderness: There is no abdominal tenderness.  Skin:    General: Skin is warm and dry.  Neurological:     Mental Status: He is alert.  Psychiatric:        Mood and Affect: Mood is not anxious.      ED Treatments / Results  Labs (all labs ordered are listed, but only abnormal results are displayed) Labs Reviewed  BASIC METABOLIC PANEL - Abnormal; Notable for the following components:      Result Value   Glucose, Bld  101 (*)    BUN 21 (*)    All other components within normal limits  CBC - Abnormal; Notable for the following components:   RBC 3.96 (*)    HCT 38.3 (*)    All other components within normal limits  TROPONIN I (HIGH SENSITIVITY)  TROPONIN I (HIGH SENSITIVITY)    EKG EKG Interpretation  Date/Time:  Tuesday November 30 2019 09:25:16 EST Ventricular Rate:  57 PR Interval:    QRS Duration: 93 QT Interval:  433 QTC Calculation: 422 R Axis:   83 Text Interpretation: Sinus bradycardia atrial flutter resolved Confirmed by Pricilla LovelessGoldston, [name] [phone]([id number]) on 11/30/2019 9:44:54 AM   Radiology Dg Chest Portable 1 View  Result Date: 11/30/2019 CLINICAL DATA:  Tachycardia. Chest pain. Personal history of atrial flutter. EXAM: PORTABLE CHEST 1 VIEW COMPARISON:  Two-view chest x-ray 10/03/2019 FINDINGS: Heart size is normal. Mild perihilar prominence is evident bilaterally. There is no edema or effusion. Lungs are otherwise clear. IMPRESSION: Mild perihilar prominence bilaterally is stable. No other acute cardiopulmonary disease. Electronically Signed   By: Marin Robertshristopher  Mattern M.D.   On: 11/30/2019 05:15    Procedures .Sedation  Date/Time: 11/30/2019 9:29 AM Performed by: Pricilla LovelessGoldston, [name], MD Authorized by: Pricilla LovelessGoldston, [name], MD   Consent:    Consent obtained:  Verbal and written   Consent given by:  Patient   Risks discussed:  Allergic reaction, dysrhythmia, inadequate sedation, nausea, prolonged hypoxia resulting in organ damage, prolonged sedation necessitating reversal, respiratory compromise necessitating ventilatory assistance and intubation and vomiting   Alternatives discussed:  Analgesia without sedation, anxiolysis and regional anesthesia Universal protocol:    Procedure explained and questions answered to patient or proxy's satisfaction: yes     Relevant documents present and verified: yes     Test results available and properly labeled: yes     Imaging studies available: yes      Required blood products, implants, devices, and special equipment available: yes     Site/side marked: yes     Immediately prior to procedure a time out was called: yes     Patient identity confirmation method:  Verbally with patient Indications:    Procedure necessitating sedation performed by:  Physician performing sedation Pre-sedation assessment:    Time since last food or drink:  12 hours   ASA classification: class 2 - patient with mild systemic disease     Neck mobility: normal     Mouth opening:  3 or  more finger widths   Thyromental distance:  4 finger widths   Mallampati score:  I - soft palate, uvula, fauces, pillars visible   Pre-sedation assessments completed and reviewed: airway patency, cardiovascular function, hydration status, mental status, nausea/vomiting, pain level, respiratory function and temperature   Immediate pre-procedure details:    Reassessment: Patient reassessed immediately prior to procedure     Reviewed: vital signs, relevant labs/tests and NPO status     Verified: bag valve mask available, emergency equipment available, intubation equipment available, IV patency confirmed, oxygen available and suction available   Procedure details (see MAR for exact dosages):    Preoxygenation:  Nasal cannula   Sedation:  Propofol   Intended level of sedation: deep   Intra-procedure monitoring:  Blood pressure monitoring, cardiac monitor, continuous pulse oximetry, frequent LOC assessments, frequent vital sign checks and continuous capnometry   Intra-procedure events: none     Total Provider sedation time (minutes):  6 Post-procedure details:    Attendance: Constant attendance by certified staff until patient recovered     Recovery: Patient returned to pre-procedure baseline     Post-sedation assessments completed and reviewed: airway patency, cardiovascular function, hydration status, mental status, nausea/vomiting, pain level, respiratory function and temperature      Patient is stable for discharge or admission: yes     Patient tolerance:  Tolerated well, no immediate complications .Cardioversion  Date/Time: 11/30/2019 9:30 AM Performed by: Sherwood Gambler, MD Authorized by: Sherwood Gambler, MD   Consent:    Consent obtained:  Verbal and written   Consent given by:  Patient   Risks discussed:  Cutaneous burn, induced arrhythmia and pain   Alternatives discussed:  Alternative treatment, rate-control medication and no treatment Pre-procedure details:    Cardioversion basis:  Emergent   Rhythm:  Atrial flutter   Electrode placement:  Anterior-posterior Patient sedated: Yes. Refer to sedation procedure documentation for details of sedation.  Attempt one:    Cardioversion mode:  Synchronous   Waveform:  Biphasic   Shock (Joules):  200   Shock outcome:  Conversion to normal sinus rhythm Post-procedure details:    Patient status:  Awake   Patient tolerance of procedure:  Tolerated well, no immediate complications   (including critical care time)  Medications Ordered in ED Medications  sodium chloride flush (NS) 0.9 % injection 3 mL (has no administration in time range)  rivaroxaban (XARELTO) tablet 20 mg (has no administration in time range)  propofol (DIPRIVAN) 10 mg/mL bolus/IV push 74.9 mg (100 mg Intravenous Given 11/30/19 0923)  sodium chloride 0.9 % bolus 1,000 mL (1,000 mLs Intravenous New Bag/Given 11/30/19 0905)  rivaroxaban (XARELTO) tablet 20 mg (20 mg Oral Given 11/30/19 1024)     Initial Impression / Assessment and Plan / ED Course  I have reviewed the triage vital signs and the nursing notes.  Pertinent labs & imaging results that were available during my care of the patient were reviewed by me and considered in my medical decision making (see chart for details).        Initial ECGs confirm atrial flutter though with no rapid rate.  Given he is symptomatic and his symptoms started last night, with otherwise low risk for stroke,  we have cardioverted him.  Patient will be started on Xarelto, given his insurance issues and he states that he was denied getting Eliquis with help last time.  He only needs it for the month post cardioversion, then I discussed he can go back to the baby aspirin.  We discussed the need for follow-up.  This patients CHA2DS2-VASc Score and unadjusted Ischemic Stroke Rate (% per year) is equal to 0.2 % stroke rate/year from a score of 0  Above score calculated as 1 point each if present [CHF, HTN, DM, Vascular=MI/PAD/Aortic Plaque, Age if 65-74, or Male] Above score calculated as 2 points each if present [Age > 75, or Stroke/TIA/TE]          Final Clinical Impressions(s) / ED Diagnoses   Final diagnoses:  Atrial flutter, paroxysmal [facility])    ED Discharge Orders         Ordered    Amb referral to AFIB Clinic     11/30/19 0849    rivaroxaban (XARELTO) 20 MG TABS tablet  Daily with supper     11/30/19 1036           Pricilla Loveless, MD 11/30/19 1040

## 2019-12-01 ENCOUNTER — Telehealth (HOSPITAL_COMMUNITY): Payer: Self-pay | Admitting: Physician Assistant

## 2019-12-01 NOTE — Telephone Encounter (Signed)
Called and left message for patient to call back.  Need to schedule f/u appt from ED visit.

## 2019-12-06 NOTE — Telephone Encounter (Signed)
Called and left a 2nd VM requesting pt call our office to schedule ED f/u appt.

## 2019-12-17 ENCOUNTER — Encounter (HOSPITAL_COMMUNITY): Payer: Self-pay | Admitting: Physician Assistant

## 2019-12-22 ENCOUNTER — Inpatient Hospital Stay (INDEPENDENT_AMBULATORY_CARE_PROVIDER_SITE_OTHER): Payer: Self-pay | Admitting: Primary Care

## 2020-01-03 NOTE — Telephone Encounter (Signed)
Letter was mailed to pt 12/17/2019 requesting he call to schedule f/u ED appt with [facility]Fib Clinic.  Had left 2 previous VMs requesting pt call back to make appt.

## 2020-03-02 ENCOUNTER — Emergency Department (HOSPITAL_COMMUNITY): Payer: BC Managed Care – PPO

## 2020-03-02 ENCOUNTER — Emergency Department (HOSPITAL_COMMUNITY)
Admission: EM | Admit: 2020-03-02 | Discharge: 2020-03-02 | Disposition: A | Discharge status: Home / Self Care | Payer: BC Managed Care – PPO | Attending: Emergency Medicine | Admitting: Emergency Medicine

## 2020-03-02 ENCOUNTER — Other Ambulatory Visit: Payer: Self-pay

## 2020-03-02 ENCOUNTER — Encounter (HOSPITAL_COMMUNITY): Payer: Self-pay | Admitting: *Deleted

## 2020-03-02 DIAGNOSIS — R0602 Shortness of breath: Secondary | ICD-10-CM | POA: Diagnosis not present

## 2020-03-02 DIAGNOSIS — I4892 Unspecified atrial flutter: Secondary | ICD-10-CM | POA: Insufficient documentation

## 2020-03-02 DIAGNOSIS — Z79899 Other long term (current) drug therapy: Secondary | ICD-10-CM | POA: Diagnosis not present

## 2020-03-02 DIAGNOSIS — F17210 Nicotine dependence, cigarettes, uncomplicated: Secondary | ICD-10-CM | POA: Insufficient documentation

## 2020-03-02 DIAGNOSIS — Z7982 Long term (current) use of aspirin: Secondary | ICD-10-CM | POA: Diagnosis not present

## 2020-03-02 DIAGNOSIS — R002 Palpitations: Secondary | ICD-10-CM | POA: Diagnosis not present

## 2020-03-02 HISTORY — DX: Unspecified atrial fibrillation: I48.91

## 2020-03-02 LAB — CBC
HCT: 42.8 % (ref 39.0–52.0)
Hemoglobin: 14.8 g/dL (ref 13.0–17.0)
MCH: 33 pg (ref 26.0–34.0)
MCHC: 34.6 g/dL (ref 30.0–36.0)
MCV: 95.3 fL (ref 80.0–100.0)
Platelets: 159 10*3/uL (ref 150–400)
RBC: 4.49 MIL/uL (ref 4.22–5.81)
RDW: 12.8 % (ref 11.5–15.5)
WBC: 5.2 10*3/uL (ref 4.0–10.5)
nRBC: 0 % (ref 0.0–0.2)

## 2020-03-02 LAB — TROPONIN I (HIGH SENSITIVITY)
Troponin I (High Sensitivity): 6 ng/L (ref <18)
Troponin I (High Sensitivity): 5 ng/L (ref <18)

## 2020-03-02 LAB — BASIC METABOLIC PANEL
Anion gap: 10 (ref 5–15)
BUN: 15 mg/dL (ref 6–20)
CO2: 21 mmol/L — ABNORMAL LOW (ref 22–32)
Calcium: 8.8 mg/dL — ABNORMAL LOW (ref 8.9–10.3)
Chloride: 106 mmol/L (ref 98–111)
Creatinine, Ser: 0.72 mg/dL (ref 0.61–1.24)
GFR calc Af Amer: >60 mL/min (ref >60)
GFR calc non Af Amer: >60 mL/min (ref >60)
Glucose, Bld: 106 mg/dL — ABNORMAL HIGH (ref 70–99)
Potassium: 4 mmol/L (ref 3.5–5.1)
Sodium: 137 mmol/L (ref 135–145)

## 2020-03-02 MED ORDER — SODIUM CHLORIDE 0.9% FLUSH: 3.0000 mL | Freq: Once | INTRAVENOUS | Status: DC

## 2020-03-02 MED ORDER — ETOMIDATE 2 MG/ML IV SOLN
INTRAVENOUS | Status: AC | PRN
  Administered 2020-03-02: 7.5 mg via INTRAVENOUS
  Administered 2020-03-02: 4 mg via INTRAVENOUS

## 2020-03-02 MED ORDER — RIVAROXABAN 20 MG PO TABS: 20.0000 mg | ORAL_TABLET | Freq: Every day | ORAL | 0 refills | Status: DC

## 2020-03-02 MED ORDER — ETOMIDATE 2 MG/ML IV SOLN
0.0500 mg/kg | Freq: Once | INTRAVENOUS | Status: DC
  Filled 2020-03-02: qty 10

## 2020-03-02 NOTE — ED Provider Notes (Signed)
[facility] EMERGENCY DEPARTMENT Provider Note   CSN: [id number] Arrival date & time: 03/02/20  9169     History Chief Complaint  Patient presents with  . Irregular Heart Beat    Russell [name] is a 44 y.o. male.  The history is provided by the patient and medical records. No language interpreter was used.     44 year old male with history of paroxysmal atrial fibrillation/atrial flutter with successful cardioversion in the past, presenting complaining of palpitation.  Patient report after dinner last night around 7 PM he developed heart palpitation.  States that symptoms feel very similar to prior atrial flutter/fibrillation that he had in the past requiring cardioversion.  He attributed to eating a salty meal but denies any other changes.  He denies any active chest pain or shortness of breath.  Does endorse mild lightheadedness.  He is currently on a baby aspirin.  No nausea vomiting diarrhea no abdominal pain.  He mention accidental overdose on energy drinks in the past thus prompting the series recurrent episodes of atrial flutter. He has not f/u with A.fib clinic due to lack of insurance.  Does not take anticoagulant due to lack of insurance.  States he finally have insurance.   Past Medical History:  Diagnosis Date  . Atrial fibrillation (HCC)   . Atrial flutter (HCC)     There are no problems to display for this patient.   Past Surgical History:  Procedure Laterality Date  . arm surgery Left        No family history on file.  Social History   Tobacco Use  . Smoking status: Current Every Day Smoker    Packs/day: 0.50    Types: Cigarettes  . Smokeless tobacco: Never Used  Substance Use Topics  . Alcohol use: Not Currently  . Drug use: Never    Home Medications Prior to Admission medications   Medication Sig Start Date End Date Taking? Authorizing Provider  acetaminophen (TYLENOL) 500 MG tablet Take 1,000 mg by mouth every 6 (six) hours as  needed for headache (pain).    [provider]  apixaban (ELIQUIS) 5 MG TABS tablet Take 1 tablet (5 mg total) by mouth 2 (two) times daily for 30 days. Patient not taking: Reported on 10/03/2019 06/18/19 07/18/19  Gerhard Munch, MD  aspirin EC 81 MG tablet Take 81 mg by mouth every morning.     [provider]  Multiple Vitamin (MULTIVITAMIN WITH MINERALS) TABS tablet Take 1 tablet by mouth every morning.    [provider]  rivaroxaban (XARELTO) 20 MG TABS tablet Take 1 tablet (20 mg total) by mouth daily with supper. 11/30/19   Pricilla Loveless, MD  vitamin B-12 (CYANOCOBALAMIN) 1000 MCG tablet Take 1,000 mcg by mouth every morning.    [provider]    Allergies    Patient has no known allergies.  Review of Systems   Review of Systems  All other systems reviewed and are negative.   Physical Exam Updated Vital Signs BP (!) 115/97 (BP Location: Right Arm)   Pulse 98   Temp (!) 97.5 F (36.4 C) (Oral)   Resp (!) 22   Ht 5\' 10"  (1.778 m)   Wt (!) 149.2 kg   SpO2 97%   BMI 47.21 kg/m   Physical Exam Vitals and nursing note reviewed.  Constitutional:      General: He is not in acute distress.    Appearance: He is well-developed.  HENT:  Head: Atraumatic.  Eyes:     Conjunctiva/sclera: Conjunctivae normal.  Cardiovascular:     Rate and Rhythm: Normal rate. Rhythm irregular.     Pulses: Normal pulses.     Heart sounds: Normal heart sounds.  Pulmonary:     Breath sounds: Normal breath sounds.  Abdominal:     Palpations: Abdomen is soft.     Tenderness: There is no abdominal tenderness.  Musculoskeletal:        General: No swelling.     Cervical back: Neck supple.  Skin:    Capillary Refill: Capillary refill takes less than 2 seconds.     Findings: No rash.  Neurological:     Mental Status: He is alert and oriented to person, place, and time.  Psychiatric:        Mood and Affect: Mood normal.     ED Results / Procedures /  Treatments   Labs (all labs ordered are listed, but only abnormal results are displayed) Labs Reviewed  BASIC METABOLIC PANEL - Abnormal; Notable for the following components:      Result Value   CO2 21 (*)    Glucose, Bld 106 (*)    Calcium 8.8 (*)    All other components within normal limits  CBC  TROPONIN I (HIGH SENSITIVITY)  TROPONIN I (HIGH SENSITIVITY)    EKG EKG Interpretation  Date/Time:  Thursday March 02 2020 06:25:52 EST Ventricular Rate:  90 PR Interval:    QRS Duration: 88 QT Interval:  394 QTC Calculation: 481 R Axis:   82 Text Interpretation: Atrial flutter with variable A-V block Nonspecific T wave abnormality Prolonged QT Abnormal ECG Confirmed by Davonna Belling [phone]) on 03/02/2020 7:40:10 AM   Date: 03/02/2020 @9 :88CZ  Rate: 63  Rhythm: normal sinus rhythm  QRS Axis: normal  Intervals: normal  ST/T Wave abnormalities: normal  Conduction Disutrbances: none  Narrative Interpretation:   Old EKG Reviewed: resolution of atrial flutter s/p cardioversion EKG was reviewed and interpreted by me     Radiology DG Chest 2 View  Result Date: 03/02/2020 CLINICAL DATA:  Shortness of breath EXAM: CHEST - 2 VIEW COMPARISON:  November 30, 2019 FINDINGS: The heart size and mediastinal contours are within normal limits. Both lungs are clear. The visualized skeletal structures are unremarkable. IMPRESSION: No active cardiopulmonary disease. Electronically Signed   By: Prudencio Pair M.D.   On: 03/02/2020 06:48    Procedures .Cardioversion  Date/Time: 03/02/2020 9:11 AM Performed by: Domenic Moras, PA-C Authorized by: Domenic Moras, PA-C   Consent:    Consent obtained:  Verbal and written   Consent given by:  Patient   Risks discussed:  Cutaneous burn, pain and induced arrhythmia   Alternatives discussed:  No treatment and alternative treatment Pre-procedure details:    Cardioversion basis:  Emergent   Rhythm:  Atrial flutter   Electrode placement:   Anterior-posterior Patient sedated: Yes. Refer to sedation procedure documentation for details of sedation.  Attempt one:    Cardioversion mode:  Synchronous   Waveform:  Biphasic   Shock (Joules):  200   Shock outcome:  Conversion to normal sinus rhythm Post-procedure details:    Patient status:  Awake   Patient tolerance of procedure:  Tolerated well, no immediate complications  .1-3 Lead EKG Interpretation Performed by: Domenic Moras, PA-C Authorized by: Domenic Moras, PA-C     Interpretation: normal     ECG rate:  63   ECG rate assessment: normal     Rhythm: sinus rhythm  Comments:     The cardiac monitor revealed normal sinus rhythm as interpreted by me. The cardiac monitor was ordered secondary to the patient's history of paroxysmal atrial flutter and to monitor the patient for dysrhythmia      (including critical care time)  Medications Ordered in ED Medications  sodium chloride flush (NS) 0.9 % injection 3 mL (3 mLs Intravenous Not Given 03/02/20 0813)  etomidate (AMIDATE) injection 7.46 mg (7.46 mg Intravenous Not Given 03/02/20 0907)  etomidate (AMIDATE) injection (4 mg Intravenous Given 03/02/20 0901)    ED Course  I have reviewed the triage vital signs and the nursing notes.  Pertinent labs & imaging results that were available during my care of the patient were reviewed by me and considered in my medical decision making (see chart for details).    MDM Rules/Calculators/A&P                      BP 122/70   Pulse 79   Temp (!) 97.5 F (36.4 C) (Oral)   Resp (!) 22   Ht 5\' 10"  (1.778 m)   Wt (!) 149.2 kg   SpO2 100%   BMI 47.21 kg/m   Final Clinical Impression(s) / ED Diagnoses Final diagnoses:  Atrial flutter, paroxysmal (HCC)    Rx / DC Orders ED Discharge Orders         Ordered    rivaroxaban (XARELTO) 20 MG TABS tablet  Daily with supper     03/02/20 0959         8:28 AM Patient here with complaints of heart palpitations since 7 PM last  night.  History of paroxysmal atrial flutter requiring cardioversion.  He is not on any anticoagulant except for baby aspirin.  He is within the window for cardioversion.  Care discussed with DR. Pickering.   9:53 AM Labs are reassuring.  Successful cardioversion using etomidate and syncronized rhythm at 200J.  Pt tolerates well.  He is stable to be discharge with Xarelto prescription and outpt afib clinic f/u.  CHAD2VASC of zero.     05/02/20, PA-C 03/02/20 1003    05/02/20, MD 03/02/20 [phone]

## 2020-03-02 NOTE — ED Notes (Signed)
Obtained consent for cardioversion and sedation. CO2 detector, zoll placed on pt.

## 2020-03-02 NOTE — Progress Notes (Signed)
RT at bedside for cardioversion. Pt stable throughout with no complications. RT not needed at this time but will continue to monitor 

## 2020-03-02 NOTE — ED Provider Notes (Signed)
  Physical Exam  BP 104/77   Pulse 60   Temp (!) 97.5 F (36.4 C) (Oral)   Resp 16   Ht 5\' 10"  (1.778 m)   Wt (!) 149.2 kg   SpO2 100%   BMI 47.21 kg/m   Physical Exam  ED Course/Procedures     .Sedation  Date/Time: 03/02/2020 1:00 PM Performed by: 05/02/2020, MD Authorized by: Benjiman Core, MD   Consent:    Consent obtained:  Verbal   Consent given by:  Patient   Risks discussed:  Allergic reaction, dysrhythmia, inadequate sedation, nausea, vomiting, respiratory compromise necessitating ventilatory assistance and intubation and prolonged hypoxia resulting in organ damage Universal protocol:    Immediately prior to procedure a time out was called: yes     Patient identity confirmation method:  Arm band and verbally with patient Indications:    Procedure performed:  Cardioversion Pre-sedation assessment:    Time since last food or drink:  8   ASA classification: class 2 - patient with mild systemic disease     Neck mobility: normal     Mouth opening:  3 or more finger widths   Thyromental distance:  4 finger widths   Mallampati score:  III - soft palate, base of uvula visible   Pre-sedation assessments completed and reviewed: airway patency   Immediate pre-procedure details:    Reassessment: Patient reassessed immediately prior to procedure     Reviewed: vital signs     Verified: bag valve mask available, emergency equipment available, intubation equipment available, IV patency confirmed and oxygen available   Procedure details (see MAR for exact dosages):    Preoxygenation:  Nasal cannula   Sedation:  Etomidate   Intended level of sedation: deep   Intra-procedure monitoring:  Blood pressure monitoring, cardiac monitor, frequent LOC assessments and frequent vital sign checks   Intra-procedure events: none     Total Provider sedation time (minutes):  10 Post-procedure details:    Attendance: Constant attendance by certified staff until patient recovered     Recovery: Patient returned to pre-procedure baseline     Patient is stable for discharge or admission: yes     Patient tolerance:  Tolerated well, no immediate complications    MDM  Patient with cardioversion.  Atrial flutter with history of same.  Went into the rhythm yesterday.  Has had cardioversion before.  Uneventful cardioversion.  Outpatient follow-up now that he has insurance.       Benjiman Core, MD 03/02/20 1510

## 2020-03-02 NOTE — ED Triage Notes (Signed)
The pt has had an irregular heart rate since yesterday  No chest pain some sob  None visible now

## 2020-03-02 NOTE — Sedation Documentation (Signed)
EDP. PA, resp at bedside for procedure.

## 2020-03-02 NOTE — Sedation Documentation (Signed)
Shock delivered at 200 jules by PA

## 2020-03-02 NOTE — Discharge Instructions (Addendum)
Please begin taking Xarelto as prescribed.  Call and follow up closely with A.fib clinic for further management of your abnormal heart rhythm.  Return if you have any concerns.

## 2020-03-03 MED FILL — XARELTO 20 MG TABLET: 20 | 30 days supply | Qty: 30 | Fill #0

## 2020-03-09 ENCOUNTER — Other Ambulatory Visit: Payer: Self-pay

## 2020-03-09 ENCOUNTER — Ambulatory Visit (HOSPITAL_COMMUNITY)
Admission: RE | Admit: 2020-03-09 | Discharge: 2020-03-09 | Disposition: A | Discharge status: Home / Self Care | Payer: BC Managed Care – PPO | Source: Ambulatory Visit | Attending: Physician Assistant | Admitting: Physician Assistant

## 2020-03-09 VITALS — BP 114/76 | HR 71 | Ht 70.0 in | Wt 334.2 lb

## 2020-03-09 DIAGNOSIS — E669 Obesity, unspecified: Secondary | ICD-10-CM | POA: Insufficient documentation

## 2020-03-09 DIAGNOSIS — Z6841 Body Mass Index (BMI) 40.0 and over, adult: Secondary | ICD-10-CM | POA: Diagnosis not present

## 2020-03-09 DIAGNOSIS — G4733 Obstructive sleep apnea (adult) (pediatric): Secondary | ICD-10-CM | POA: Diagnosis not present

## 2020-03-09 DIAGNOSIS — I483 Typical atrial flutter: Secondary | ICD-10-CM | POA: Diagnosis not present

## 2020-03-09 MED ORDER — RIVAROXABAN 20 MG PO TABS: 20.0000 mg | ORAL_TABLET | Freq: Every day | ORAL | 3 refills | Status: DC

## 2020-03-09 MED FILL — XARELTO 20 MG TABLET: 20 | 30 days supply | Qty: 30 | Fill #0

## 2020-03-09 NOTE — Progress Notes (Signed)
Primary Care Physician: Patient, No Pcp Per Primary Cardiologist: Dr Mauricio Po Primary Electrophysiologist: none Referring Physician: [facility]   Russell [name] is a 44 y.o. male with a history of atrial flutter and OSA who presents for consultation in the [facility].  The patient was initially diagnosed with atrial flutter in [place] 08/2017 and has had numerous cardioversions. Caffeine and Sprite appear to have been triggers for him. He has been on amiodarone in the past. Patient is not on long term anticoagulation with a CHADS2VASC score of 0. Patient has not followed up after his previous cardoversions due to a lack of insurance. He was seen in the ER on 03/02/20 with symptoms of palpitations and underwent successful DCCV. He was started on Xarelto. He denies any history of afib. He denies significant alcohol use. He has OSA but has not been on CPAP 2/2 cost.  Today, he denies symptoms of palpitations, chest pain, shortness of breath, orthopnea, PND, lower extremity edema, dizziness, presyncope, syncope, bleeding, or neurologic sequela. The patient is tolerating medications without difficulties and is otherwise without complaint today.    Atrial Fibrillation Risk Factors:  he does have symptoms or diagnosis of sleep apnea. he is not compliant with CPAP therapy. he does not have a history of rheumatic fever. he does have a history of alcohol use. The patient does not have a history of early familial atrial fibrillation or other arrhythmias.  he has a BMI of Body mass index is 47.95 kg/m.[name] Filed Weights   03/09/20 1445  Weight: (!) 151.6 kg    No family history on file.   Atrial Fibrillation Management history:  Previous antiarrhythmic drugs: amiodarone Previous cardioversions: several, most recently 03/02/20 Previous ablations: none CHADS2VASC score: 0 Anticoagulation history: Xarelto   Past Medical History:  Diagnosis Date  . Atrial  fibrillation ([place])   . Atrial flutter [facility])    Past Surgical History:  Procedure Laterality Date  . arm surgery Left     Current Outpatient Medications  Medication Sig Dispense Refill  . acetaminophen (TYLENOL) 500 MG tablet Take 1,000 mg by mouth as needed for headache (pain).     . Multiple Vitamin (MULTIVITAMIN WITH MINERALS) TABS tablet Take 1 tablet by mouth every morning.    . rivaroxaban (XARELTO) 20 MG TABS tablet Take 1 tablet (20 mg total) by mouth daily with supper. 30 tablet 3   No current facility-administered medications for this encounter.    No Known Allergies  Social History   Socioeconomic History  . Marital status: Single    Spouse name: Not on file  . Number of children: Not on file  . Years of education: Not on file  . Highest education level: Not on file  Occupational History  . Not on file  Tobacco Use  . Smoking status: Current Every Day Smoker    Packs/day: 0.50    Types: Cigarettes  . Smokeless tobacco: Never Used  Substance and Sexual Activity  . Alcohol use: Not Currently  . Drug use: Never  . Sexual activity: Not on file  Other Topics Concern  . Not on file  Social History Narrative  . Not on file   Social Determinants of Health   Financial Resource Strain:   . Difficulty of Paying Living Expenses:   Food Insecurity:   . Worried About Charity fundraiser in the Last Year:   . Arboriculturist in the Last Year:   Transportation Needs:   .  Lack of Transportation (Medical):   [name] Lack of Transportation (Non-Medical):   Physical Activity:   . Days of Exercise per Week:   . Minutes of Exercise per Session:   Stress:   . Feeling of Stress :   Social Connections:   . Frequency of Communication with Friends and Family:   . Frequency of Social Gatherings with Friends and Family:   . Attends Religious Services:   . Active Member of Clubs or Organizations:   . Attends Banker Meetings:   [name] Marital Status:   Intimate Partner  Violence:   . Fear of Current or Ex-Partner:   . Emotionally Abused:   [name] Physically Abused:   . Sexually Abused:      ROS- All systems are reviewed and negative except as per the HPI above.  Physical Exam: Vitals:   03/09/20 1445  BP: 114/76  Pulse: 71  Weight: (!) 151.6 kg  Height: 5\' 10"  (1.778 m)    GEN- The patient is well appearing obese male, alert and oriented x 3 today.   Head- normocephalic, atraumatic Eyes-  Sclera clear, conjunctiva pink Ears- hearing intact Oropharynx- clear Neck- supple  Lungs- Clear to ausculation bilaterally, normal work of breathing Heart- Regular rate and rhythm, no murmurs, rubs or gallops  GI- soft, NT, ND, + BS Extremities- no clubbing, cyanosis, or edema MS- no significant deformity or atrophy Skin- no rash or lesion Psych- euthymic mood, full affect Neuro- strength and sensation are intact  Wt Readings from Last 3 Encounters:  03/09/20 (!) 151.6 kg  03/02/20 (!) 149.2 kg  11/30/19 (!) 149.7 kg    EKG today demonstrates SR HR 71, PR 154, QRS 90, QTc 436  Epic records are reviewed at length today  CHA2DS2-VASc Score = 0 The patient's score is based upon: CHF History: No HTN History: No Age : < 65 Diabetes History: No Stroke History: No Vascular Disease History: No Gender: Male      ASSESSMENT AND PLAN: 1. Typical atrial flutter The patient's CHA2DS2-VASc score is 0, indicating a 0.2% annual risk of stroke. S/p DCCV on 03/02/20. Patient does not have any documented history of afib. We discussed therapeutic options today including ablation. After discussing the risks and benefits, patient would like to be considered for the procedure. Will refer to EP. Continue Xarelto for at least 4 weeks post DCCV. Would also continue until after ablation if this is pursued.   2. Obesity Body mass index is 47.95 kg/m. Lifestyle modification was discussed at length including regular exercise and weight reduction.  3. Obstructive  sleep apnea The importance of adequate treatment of sleep apnea was discussed today in order to improve our ability to maintain sinus rhythm long term. Patient agreeable to resuming CPAP. He will reach out to his previous provider to resume.    Follow up with EP to consider ablation.    05/02/20 PA-C [facility] [address] [place], [place] [phone] 03/09/2020 3:32 PM

## 2020-03-27 ENCOUNTER — Encounter: Payer: Self-pay | Admitting: Internal Medicine

## 2020-03-27 ENCOUNTER — Other Ambulatory Visit: Payer: Self-pay

## 2020-03-27 ENCOUNTER — Ambulatory Visit (INDEPENDENT_AMBULATORY_CARE_PROVIDER_SITE_OTHER): Payer: BC Managed Care – PPO | Admitting: Internal Medicine

## 2020-03-27 VITALS — BP 120/88 | HR 72 | Ht 70.0 in | Wt 339.4 lb

## 2020-03-27 DIAGNOSIS — I483 Typical atrial flutter: Secondary | ICD-10-CM | POA: Diagnosis not present

## 2020-03-27 NOTE — Patient Instructions (Addendum)
Medication Instructions:  Your physician recommends that you continue on your current medications as directed. Please refer to the Current Medication list given to you today.  Labwork: None ordered.  Testing/Procedures: Your physician has recommended that you have an ablation. Catheter ablation is a medical procedure used to treat some cardiac arrhythmias (irregular heartbeats). During catheter ablation, a long, thin, flexible tube is put into a blood vessel in your groin (upper thigh), or neck. This tube is called an ablation catheter. It is then guided to your heart through the blood vessel. Radio frequency waves destroy small areas of heart tissue where abnormal heartbeats may cause an arrhythmia to start. Please see the instruction sheet given to you today.   Follow-Up: The following dates are available for procedures:  April 12, 15, 19, 26 May 3, 10, 13  Any Other Special Instructions Will Be Listed Below (If Applicable).  If you need a refill on your cardiac medications before your next appointment, please call your pharmacy.    Cardiac Ablation  Cardiac ablation is a procedure to stop some heart tissue from causing problems. The heart has many electrical connections. Sometimes these connections make the heart beat very fast or irregularly. Removing some problem areas can improve the heart rhythm or make it normal. What happens before the procedure?  Follow instructions from your doctor about what you cannot eat or drink.  Ask your doctor about: ? Changing or stopping your normal medicines. This is important if you take diabetes medicines or blood thinners. ? Taking medicines such as aspirin and ibuprofen. These medicines can thin your blood. Do not take these medicines before your procedure if your doctor tells you not to.  Plan to have someone take you home.  If you will be going home right after the procedure, plan to have someone with you for 24 hours. What happens during  the procedure?  To lower your risk of infection: ? Your health care team will wash or sanitize their hands. ? Your skin will be washed with soap. ? Hair may be removed from your neck or groin.  An IV tube will be put into one of your veins.  You will be given a medicine to help you relax (sedative).  Skin on your neck or groin will be numbed.  A cut (incision) will be made in your neck or groin.  A needle will be put through your cut and into a vein in your neck or groin.  A tube (catheter) will be put into the needle. The tube will be moved to your heart. X-rays (fluoroscopy) will be used to help guide the tube.  Small devices (electrodes) on the tip of the tube will send out electrical currents.  Dye may be put through the tube. This helps your surgeon see your heart.  Electrical energy will be used to scar (ablate) some heart tissue. Your surgeon may use: ? Heat (radiofrequency energy). ? Laser energy. ? Extreme cold (cryoablation).  The tube will be taken out.  Pressure will be held on your cut. This helps stop bleeding.  A bandage (dressing) will be put on your cut. The procedure may vary. What happens after the procedure?  You will be monitored until your medicines have worn off.  Your cut will be watched for bleeding. You will need to lie still for a few hours.  Do not drive for 24 hours or as long as your doctor tells you. Summary  Cardiac ablation is a procedure to stop some  heart tissue from causing problems.  Electrical energy will be used to scar (ablate) some heart tissue. This information is not intended to replace advice given to you by your health care provider. Make sure you discuss any questions you have with your health care provider. Document Revised: 11/28/2017 Document Reviewed: 11/04/2016 Elsevier Patient Education  2020 [employer].

## 2020-03-27 NOTE — Progress Notes (Signed)
    HPI Mr. Karpf is referred today by the atrial fib clinic for evaluation of recurrent episodes of atrial flutter. He has not had atrial fib. He is morbidly obese and when he goes into atrial fib he feels weak and tired. He denies anginal symptoms. He has had multiple episodes of atrial flutter with a RVR and controlled Vr. His CHADS Vasc score is 0.  He was last cardioverted back in early March.  No Known Allergies   Current Outpatient Medications  Medication Sig Dispense Refill  . acetaminophen (TYLENOL) 500 MG tablet Take 1,000 mg by mouth as needed for headache (pain).     . Multiple Vitamin (MULTIVITAMIN WITH MINERALS) TABS tablet Take 1 tablet by mouth every morning.    . rivaroxaban (XARELTO) 20 MG TABS tablet Take 1 tablet (20 mg total) by mouth daily with supper. 30 tablet 3   No current facility-administered medications for this visit.     Past Medical History:  Diagnosis Date  . Atrial fibrillation ([facility])   . Atrial flutter ([facility])     ROS:   All systems reviewed and negative except as noted in the HPI.   Past Surgical History:  Procedure Laterality Date  . arm surgery Left      History reviewed. No pertinent family history.   Social History   Socioeconomic History  . Marital status: Single    Spouse name: Not on file  . Number of children: Not on file  . Years of education: Not on file  . Highest education level: Not on file  Occupational History  . Not on file  Tobacco Use  . Smoking status: Current Every Day Smoker    Packs/day: 0.50    Types: Cigarettes  . Smokeless tobacco: Never Used  Substance and Sexual Activity  . Alcohol use: Not Currently  . Drug use: Never  . Sexual activity: Not on file  Other Topics Concern  . Not on file  Social History Narrative  . Not on file   Social Determinants of Health   Financial Resource Strain:   . Difficulty of Paying Living Expenses:   Food Insecurity:   . Worried About Running Out of Food in  the Last Year:   . Ran Out of Food in the Last Year:   Transportation Needs:   . Lack of Transportation (Medical):   . Lack of Transportation (Non-Medical):   Physical Activity:   . Days of Exercise per Week:   . Minutes of Exercise per Session:   Stress:   . Feeling of Stress :   Social Connections:   . Frequency of Communication with Friends and Family:   . Frequency of Social Gatherings with Friends and Family:   . Attends Religious Services:   . Active Member of Clubs or Organizations:   . Attends Club or Organization Meetings:   . Marital Status:   Intimate Partner Violence:   . Fear of Current or Ex-Partner:   . Emotionally Abused:   . Physically Abused:   . Sexually Abused:      BP 120/88   Pulse 72   Ht 5' 10" (1.778 m)   Wt (!) 339 lb 6.4 oz (154 kg)   SpO2 98%   BMI 48.70 kg/m   Physical Exam:  Morbidly obese appearing young man, NAD HEENT: Unremarkable Neck:  No JVD, no thyromegally Lymphatics:  No adenopathy Back:  No CVA tenderness Lungs:  Clear with no wheezes HEART:  Regular rate rhythm, no   murmurs, no rubs, no clicks Abd:  soft, positive bowel sounds, no organomegally, no rebound, no guarding Ext:  2 plus pulses, no edema, no cyanosis, no clubbing Skin:  No rashes no nodules Neuro:  CN II through XII intact, motor grossly intact  EKG - nsr  Assess/Plan: 1. Atrial flutter - I have discussed the treatment options with the patient. He has been cardioverted 3-4 times and has had recurrent atrial flutter. I have reviewed the indications/risks/benefits/alternative to catheter ablation. He will call us if he wishes to proceed. Because of his morbid obesity, I will plan CARTO and anesthesia.  2. Morbid obesity - I have discussed the importance of weight loss as a part of his treatment plan.  3. Sleep apnea - he has been diagnosed with sleep apnea and has been off of his CPAP. As he has just gotten insurance back, he plans to pursue re-initiating the use of  CPAP.   Russell [name].D.

## 2020-03-27 NOTE — H&P (View-Only) (Signed)
HPI Mr. Aldridge is referred today by the atrial fib clinic for evaluation of recurrent episodes of atrial flutter. He has not had atrial fib. He is morbidly obese and when he goes into atrial fib he feels weak and tired. He denies anginal symptoms. He has had multiple episodes of atrial flutter with a RVR and controlled Vr. His CHADS Vasc score is 0.  He was last cardioverted back in early March.  No Known Allergies   Current Outpatient Medications  Medication Sig Dispense Refill  . acetaminophen (TYLENOL) 500 MG tablet Take 1,000 mg by mouth as needed for headache (pain).     . Multiple Vitamin (MULTIVITAMIN WITH MINERALS) TABS tablet Take 1 tablet by mouth every morning.    . rivaroxaban (XARELTO) 20 MG TABS tablet Take 1 tablet (20 mg total) by mouth daily with supper. 30 tablet 3   No current facility-administered medications for this visit.     Past Medical History:  Diagnosis Date  . Atrial fibrillation ([place])   . Atrial flutter ([place])     ROS:   All systems reviewed and negative except as noted in the HPI.   Past Surgical History:  Procedure Laterality Date  . arm surgery Left      History reviewed. No pertinent family history.   Social History   Socioeconomic History  . Marital status: Single    Spouse name: Not on file  . Number of children: Not on file  . Years of education: Not on file  . Highest education level: Not on file  Occupational History  . Not on file  Tobacco Use  . Smoking status: Current Every Day Smoker    Packs/day: 0.50    Types: Cigarettes  . Smokeless tobacco: Never Used  Substance and Sexual Activity  . Alcohol use: Not Currently  . Drug use: Never  . Sexual activity: Not on file  Other Topics Concern  . Not on file  Social History Narrative  . Not on file   Social Determinants of Health   Financial Resource Strain:   . Difficulty of Paying Living Expenses:   Food Insecurity:   . Worried About Charity fundraiser in  the Last Year:   . Arboriculturist in the Last Year:   Transportation Needs:   . Film/video editor (Medical):   [name] Lack of Transportation (Non-Medical):   Physical Activity:   . Days of Exercise per Week:   . Minutes of Exercise per Session:   Stress:   . Feeling of Stress :   Social Connections:   . Frequency of Communication with Friends and Family:   . Frequency of Social Gatherings with Friends and Family:   . Attends Religious Services:   . Active Member of Clubs or Organizations:   . Attends Archivist Meetings:   [name] Marital Status:   Intimate Partner Violence:   . Fear of Current or Ex-Partner:   . Emotionally Abused:   [name] Physically Abused:   . Sexually Abused:      BP 120/88   Pulse 72   Ht 5\' 10"  (1.778 m)   Wt (!) 339 lb 6.4 oz (154 kg)   SpO2 98%   BMI 48.70 kg/m   Physical Exam:  Morbidly obese appearing young man, NAD HEENT: Unremarkable Neck:  No JVD, no thyromegally Lymphatics:  No adenopathy Back:  No CVA tenderness Lungs:  Clear with no wheezes HEART:  Regular rate rhythm, no  murmurs, no rubs, no clicks Abd:  soft, positive bowel sounds, no organomegally, no rebound, no guarding Ext:  2 plus pulses, no edema, no cyanosis, no clubbing Skin:  No rashes no nodules Neuro:  CN II through XII intact, motor grossly intact  EKG - nsr  Assess/Plan: 1. Atrial flutter - I have discussed the treatment options with the patient. He has been cardioverted 3-4 times and has had recurrent atrial flutter. I have reviewed the indications/risks/benefits/alternative to catheter ablation. He will call us if he wishes to proceed. Because of his morbid obesity, I will plan CARTO and anesthesia.  2. Morbid obesity - I have discussed the importance of weight loss as a part of his treatment plan.  3. Sleep apnea - he has been diagnosed with sleep apnea and has been off of his CPAP. As he has just gotten insurance back, he plans to pursue re-initiating the use of  CPAP.   Leonia Reeves.D.

## 2020-03-28 NOTE — Addendum Note (Signed)
Addended by: Vernard Gambles on: 03/28/2020 01:46 PM   Modules accepted: Orders

## 2020-03-29 MED FILL — XARELTO 20 MG TABLET: 20 | 30 days supply | Qty: 30 | Fill #0

## 2020-04-14 ENCOUNTER — Other Ambulatory Visit: Payer: Self-pay

## 2020-04-14 ENCOUNTER — Other Ambulatory Visit (HOSPITAL_COMMUNITY)
Admission: RE | Admit: 2020-04-14 | Discharge: 2020-04-14 | Disposition: A | Discharge status: Home / Self Care | Payer: BC Managed Care – PPO | Source: Ambulatory Visit | Attending: Internal Medicine | Admitting: Internal Medicine

## 2020-04-14 ENCOUNTER — Other Ambulatory Visit: Payer: BC Managed Care – PPO | Admitting: *Deleted

## 2020-04-14 DIAGNOSIS — Z20822 Contact with and (suspected) exposure to covid-19: Secondary | ICD-10-CM | POA: Diagnosis not present

## 2020-04-14 DIAGNOSIS — Z01812 Encounter for preprocedural laboratory examination: Secondary | ICD-10-CM | POA: Insufficient documentation

## 2020-04-14 DIAGNOSIS — I483 Typical atrial flutter: Secondary | ICD-10-CM

## 2020-04-14 LAB — BASIC METABOLIC PANEL
BUN/Creatinine Ratio: 23 — ABNORMAL HIGH (ref 9–20)
BUN: 19 mg/dL (ref 6–24)
CO2: 24 mmol/L (ref 20–29)
Calcium: 8.8 mg/dL (ref 8.7–10.2)
Chloride: 103 mmol/L (ref 96–106)
Creatinine, Ser: 0.81 mg/dL (ref 0.76–1.27)
GFR calc Af Amer: 126 mL/min/{1.73_m2} (ref >59)
GFR calc non Af Amer: 109 mL/min/{1.73_m2} (ref >59)
Glucose: 69 mg/dL (ref 65–99)
Potassium: 4.1 mmol/L (ref 3.5–5.2)
Sodium: 139 mmol/L (ref 134–144)

## 2020-04-14 LAB — CBC WITH DIFFERENTIAL/PLATELET
Basophils Absolute: 0.1 10*3/uL (ref 0.0–0.2)
Basos: 1 %
EOS (ABSOLUTE): 0.3 10*3/uL (ref 0.0–0.4)
Eos: 4 %
Hematocrit: 37.8 % (ref 37.5–51.0)
Hemoglobin: 13 g/dL (ref 13.0–17.7)
Immature Grans (Abs): 0 10*3/uL (ref 0.0–0.1)
Immature Granulocytes: 1 %
Lymphocytes Absolute: 2.3 10*3/uL (ref 0.7–3.1)
Lymphs: 27 %
MCH: 33.9 pg — ABNORMAL HIGH (ref 26.6–33.0)
MCHC: 34.4 g/dL (ref 31.5–35.7)
MCV: 98 fL — ABNORMAL HIGH (ref 79–97)
Monocytes Absolute: 0.6 10*3/uL (ref 0.1–0.9)
Monocytes: 7 %
Neutrophils Absolute: 5.1 10*3/uL (ref 1.4–7.0)
Neutrophils: 60 %
Platelets: 167 10*3/uL (ref 150–450)
RBC: 3.84 x10E6/uL — ABNORMAL LOW (ref 4.14–5.80)
RDW: 12.5 % (ref 11.6–15.4)
WBC: 8.4 10*3/uL (ref 3.4–10.8)

## 2020-04-14 LAB — SARS CORONAVIRUS 2 (TAT 6-24 HRS): SARS Coronavirus 2: NEGATIVE

## 2020-04-17 ENCOUNTER — Ambulatory Visit (HOSPITAL_COMMUNITY)
Admission: RE | Admit: 2020-04-17 | Discharge: 2020-04-17 | Disposition: A | Discharge status: Home / Self Care | Payer: BC Managed Care – PPO | Attending: Internal Medicine | Admitting: Internal Medicine

## 2020-04-17 ENCOUNTER — Other Ambulatory Visit: Payer: Self-pay

## 2020-04-17 ENCOUNTER — Encounter (HOSPITAL_COMMUNITY): Payer: Self-pay | Admitting: Internal Medicine

## 2020-04-17 ENCOUNTER — Ambulatory Visit (HOSPITAL_COMMUNITY): Payer: BC Managed Care – PPO | Admitting: Anesthesiology

## 2020-04-17 ENCOUNTER — Encounter (HOSPITAL_COMMUNITY)
Admission: RE | Disposition: A | Discharge status: Home / Self Care | Payer: BC Managed Care – PPO | Source: Home / Self Care | Attending: Internal Medicine

## 2020-04-17 DIAGNOSIS — Z7901 Long term (current) use of anticoagulants: Secondary | ICD-10-CM | POA: Diagnosis not present

## 2020-04-17 DIAGNOSIS — F17210 Nicotine dependence, cigarettes, uncomplicated: Secondary | ICD-10-CM | POA: Diagnosis not present

## 2020-04-17 DIAGNOSIS — Z6841 Body Mass Index (BMI) 40.0 and over, adult: Secondary | ICD-10-CM | POA: Insufficient documentation

## 2020-04-17 DIAGNOSIS — I4891 Unspecified atrial fibrillation: Secondary | ICD-10-CM | POA: Insufficient documentation

## 2020-04-17 DIAGNOSIS — I483 Typical atrial flutter: Secondary | ICD-10-CM | POA: Insufficient documentation

## 2020-04-17 DIAGNOSIS — G4730 Sleep apnea, unspecified: Secondary | ICD-10-CM | POA: Diagnosis not present

## 2020-04-17 HISTORY — PX: A-FLUTTER ABLATION: EP1230

## 2020-04-17 SURGERY — A-FLUTTER ABLATION
Anesthesia: General

## 2020-04-17 MED ORDER — SODIUM CHLORIDE 0.9% FLUSH: 3.0000 mL | INTRAVENOUS | Status: DC | PRN

## 2020-04-17 MED ORDER — PHENYLEPHRINE HCL-NACL 10-0.9 MG/250ML-% IV SOLN
INTRAVENOUS | Status: DC | PRN
  Administered 2020-04-17: 20 ug/min via INTRAVENOUS

## 2020-04-17 MED ORDER — DEXAMETHASONE SODIUM PHOSPHATE 10 MG/ML IJ SOLN
INTRAMUSCULAR | Status: DC | PRN
  Administered 2020-04-17: 4 mg via INTRAVENOUS

## 2020-04-17 MED ORDER — SUGAMMADEX SODIUM 200 MG/2ML IV SOLN
INTRAVENOUS | Status: DC | PRN
  Administered 2020-04-17: 300 mg via INTRAVENOUS

## 2020-04-17 MED ORDER — SUCCINYLCHOLINE 20MG/ML (10ML) SYRINGE FOR MEDFUSION PUMP - OPTIME
INTRAMUSCULAR | Status: DC | PRN
  Administered 2020-04-17: 200 mg via INTRAVENOUS

## 2020-04-17 MED ORDER — LIDOCAINE 2% (20 MG/ML) 5 ML SYRINGE
INTRAMUSCULAR | Status: DC | PRN
  Administered 2020-04-17: 100 mg via INTRAVENOUS

## 2020-04-17 MED ORDER — ROCURONIUM BROMIDE 10 MG/ML (PF) SYRINGE
PREFILLED_SYRINGE | INTRAVENOUS | Status: DC | PRN
  Administered 2020-04-17: 30 mg via INTRAVENOUS
  Administered 2020-04-17: 100 mg via INTRAVENOUS

## 2020-04-17 MED ORDER — SODIUM CHLORIDE 0.9 % IV SOLN: 250.0000 mL | INTRAVENOUS | Status: DC | PRN

## 2020-04-17 MED ORDER — HEPARIN (PORCINE) IN NACL 1000-0.9 UT/500ML-% IV SOLN
INTRAVENOUS | Status: DC | PRN
  Administered 2020-04-17: 500 mL

## 2020-04-17 MED ORDER — BUPIVACAINE HCL (PF) 0.25 % IJ SOLN
INTRAMUSCULAR | Status: DC | PRN
  Administered 2020-04-17: 40 mL

## 2020-04-17 MED ORDER — ONDANSETRON HCL 4 MG/2ML IJ SOLN
INTRAMUSCULAR | Status: DC | PRN
  Administered 2020-04-17: 4 mg via INTRAVENOUS

## 2020-04-17 MED ORDER — ONDANSETRON HCL 4 MG/2ML IJ SOLN: 4.0000 mg | Freq: Four times a day (QID) | INTRAMUSCULAR | Status: DC | PRN

## 2020-04-17 MED ORDER — PROPOFOL 10 MG/ML IV BOLUS
INTRAVENOUS | Status: DC | PRN
  Administered 2020-04-17: 200 mg via INTRAVENOUS

## 2020-04-17 MED ORDER — BUPIVACAINE HCL (PF) 0.25 % IJ SOLN
INTRAMUSCULAR | Status: AC
  Filled 2020-04-17: qty 60

## 2020-04-17 MED ORDER — SODIUM CHLORIDE 0.9 % IV SOLN: INTRAVENOUS | Status: DC

## 2020-04-17 MED ORDER — FENTANYL CITRATE (PF) 250 MCG/5ML IJ SOLN
INTRAMUSCULAR | Status: DC | PRN
  Administered 2020-04-17: 50 ug via INTRAVENOUS
  Administered 2020-04-17: 100 ug via INTRAVENOUS
  Administered 2020-04-17: 50 ug via INTRAVENOUS

## 2020-04-17 MED ORDER — ACETAMINOPHEN 325 MG PO TABS
ORAL_TABLET | ORAL | Status: AC
  Filled 2020-04-17: qty 2

## 2020-04-17 MED ORDER — MIDAZOLAM HCL 5 MG/5ML IJ SOLN
INTRAMUSCULAR | Status: DC | PRN
  Administered 2020-04-17: 2 mg via INTRAVENOUS

## 2020-04-17 MED ORDER — ACETAMINOPHEN 325 MG PO TABS
650.0000 mg | ORAL_TABLET | ORAL | Status: DC | PRN
  Administered 2020-04-17: 12:00:00 650 mg via ORAL

## 2020-04-17 SURGICAL SUPPLY — 12 items
BAG SNAP BAND KOVER 36X36 (MISCELLANEOUS) ×3 IMPLANT
CATH DECANAV F CURVE (CATHETERS) ×3 IMPLANT
CATH EZ STEER NAV 8MM F-J CUR (ABLATOR) ×3 IMPLANT
PACK EP LATEX FREE (CUSTOM PROCEDURE TRAY) ×2
PACK EP LF (CUSTOM PROCEDURE TRAY) ×1 IMPLANT
PAD PRO RADIOLUCENT 2001M-C (PAD) ×3 IMPLANT
PATCH CARTO3 (PAD) ×3 IMPLANT
SHEATH PINNACLE 6F 10CM (SHEATH) IMPLANT
SHEATH PINNACLE 7F 10CM (SHEATH) ×3 IMPLANT
SHEATH PINNACLE 8F 10CM (SHEATH) ×3 IMPLANT
SHEATH PINNACLE 9F 10CM (SHEATH) ×3 IMPLANT
SHEATH SWARTZ RAMP 8.5F 60CM (SHEATH) ×3 IMPLANT

## 2020-04-17 NOTE — Transfer of Care (Signed)
Immediate Anesthesia Transfer of Care Note  Patient: Russell [name]  Procedure(s) Performed: A-FLUTTER ABLATION (N/A )  Patient Location: PACU and Cath Lab  Anesthesia Type:General  Level of Consciousness: awake, alert  and patient cooperative  Airway & Oxygen Therapy: Patient Spontanous Breathing and Patient connected to nasal cannula oxygen  Post-op Assessment: Report given to RN and Post -op Vital signs reviewed and stable  Post vital signs: Reviewed and stable  Last Vitals:  Vitals Value Taken Time  BP 118/81 04/17/20 1027  Temp    Pulse 71 04/17/20 1029  Resp 17 04/17/20 1029  SpO2 94 % 04/17/20 1029  Vitals shown include unvalidated device data.  Last Pain:  Vitals:   04/17/20 0602  TempSrc:   PainSc: 0-No pain         Complications: No apparent anesthesia complications

## 2020-04-17 NOTE — Anesthesia Preprocedure Evaluation (Addendum)
Anesthesia Evaluation  Patient identified by MRN, date of birth, ID band Patient awake    Reviewed: Allergy & Precautions, NPO status , Patient's Chart, lab work & pertinent test results  Airway Mallampati: III  TM Distance: >3 FB Neck ROM: Full    Dental no notable dental hx. (+) Teeth Intact, Dental Advisory Given   Pulmonary neg pulmonary ROS, Current Smoker and Patient abstained from smoking.,    Pulmonary exam normal breath sounds clear to auscultation       Cardiovascular Normal cardiovascular exam+ dysrhythmias Atrial Fibrillation  Rhythm:Regular Rate:Normal     Neuro/Psych negative neurological ROS  negative psych ROS   GI/Hepatic negative GI ROS, Neg liver ROS,   Endo/Other  Morbid obesity (BMI 48)  Renal/GU negative Renal ROS  negative genitourinary   Musculoskeletal negative musculoskeletal ROS (+)   Abdominal   Peds  Hematology  (+) Blood dyscrasia (on xarelto), ,   Anesthesia Other Findings   Reproductive/Obstetrics                           Anesthesia Physical Anesthesia Plan  ASA: III  Anesthesia Plan: General   Post-op Pain Management:    Induction: Intravenous  PONV Risk Score and Plan: 1 and Midazolam, Dexamethasone and Ondansetron  Airway Management Planned: Oral ETT  Additional Equipment:   Intra-op Plan:   Post-operative Plan: Extubation in OR  Informed Consent: I have reviewed the patients History and Physical, chart, labs and discussed the procedure including the risks, benefits and alternatives for the proposed anesthesia with the patient or authorized representative who has indicated his/her understanding and acceptance.     Dental advisory given  Plan Discussed with: CRNA  Anesthesia Plan Comments:         Anesthesia Quick Evaluation

## 2020-04-17 NOTE — Anesthesia Postprocedure Evaluation (Signed)
Anesthesia Post Note  Patient: NATHANEIL FEAGANS  Procedure(s) Performed: A-FLUTTER ABLATION (N/A )     Patient location during evaluation: Cath Lab Anesthesia Type: General Level of consciousness: awake and alert Pain management: pain level controlled Vital Signs Assessment: post-procedure vital signs reviewed and stable Respiratory status: spontaneous breathing, nonlabored ventilation, respiratory function stable and patient connected to nasal cannula oxygen Cardiovascular status: blood pressure returned to baseline and stable Postop Assessment: no apparent nausea or vomiting Anesthetic complications: no    Last Vitals:  Vitals:   04/17/20 1115 04/17/20 1158  BP: 122/80 110/83  Pulse: 65 63  Resp: 16 (!) 0  Temp:    SpO2: 96% 98%    Last Pain:  Vitals:   04/17/20 1159  TempSrc:   PainSc: 4                  Arijana Narayan L Lorenz Donley

## 2020-04-17 NOTE — Discharge Instructions (Signed)

## 2020-04-17 NOTE — Anesthesia Procedure Notes (Signed)
Procedure Name: Intubation Date/Time: 04/17/2020 7:49 AM Performed by: Adria Dill, CRNA Pre-anesthesia Checklist: Patient identified, Emergency Drugs available, Suction available and Patient being monitored Patient Re-evaluated:Patient Re-evaluated prior to induction Oxygen Delivery Method: Circle system utilized Preoxygenation: Pre-oxygenation with 100% oxygen Induction Type: IV induction Ventilation: Mask ventilation without difficulty Laryngoscope Size: Glidescope and 4 Grade View: Grade I Tube type: Oral Tube size: 7.5 mm Number of attempts: 1 Airway Equipment and Method: Stylet and Oral airway Placement Confirmation: ETT inserted through vocal cords under direct vision,  positive ETCO2 and breath sounds checked- equal and bilateral Secured at: 22 cm Tube secured with: Tape Dental Injury: Teeth and Oropharynx as per pre-operative assessment

## 2020-04-17 NOTE — Interval H&P Note (Signed)
History and Physical Interval Note:  04/17/2020 7:25 AM  Russell [name]  has presented today for surgery, with the diagnosis of Atrial flutter.  The various methods of treatment have been discussed with the patient and family. After consideration of risks, benefits and other options for treatment, the patient has consented to  Procedure(s): A-FLUTTER ABLATION (N/A) as a surgical intervention.  The patient's history has been reviewed, patient examined, no change in status, stable for surgery.  I have reviewed the patient's chart and labs.  Questions were answered to the patient's satisfaction.     Lewayne Bunting

## 2020-05-01 MED FILL — XARELTO 20 MG TABLET: 20 | 30 days supply | Qty: 30 | Fill #1

## 2020-05-18 MED FILL — XARELTO 20 MG TABLET: 20 | 30 days supply | Qty: 30 | Fill #1

## 2020-05-31 ENCOUNTER — Ambulatory Visit: Payer: BC Managed Care – PPO | Admitting: Internal Medicine

## 2020-05-31 ENCOUNTER — Other Ambulatory Visit: Payer: Self-pay

## 2020-05-31 ENCOUNTER — Encounter: Payer: Self-pay | Admitting: Internal Medicine

## 2020-05-31 VITALS — BP 116/66 | HR 76 | Ht 70.0 in | Wt 340.0 lb

## 2020-05-31 DIAGNOSIS — I483 Typical atrial flutter: Secondary | ICD-10-CM | POA: Diagnosis not present

## 2020-05-31 NOTE — Patient Instructions (Addendum)
Medication Instructions:  Your physician has recommended you make the following change in your medication:   Stop taking Xarelto!  Labwork: None ordered.  Testing/Procedures: None ordered.  Follow-Up: Your physician wants you to follow-up in: as needed with Dr. Ladona Ridgel.   Any Other Special Instructions Will Be Listed Below (If Applicable).  If you need a refill on your cardiac medications before your next appointment, please call your pharmacy.

## 2020-05-31 NOTE — Progress Notes (Signed)
HPI Mr. Hornbaker returns today for followup of atrial flutter. He underwent catheter ablation about 6 weeks ago. In the interim he has done well with no symptomatic arrhythmias. He denies chest pain or sob. No syncope. He has had peripheral edema. He and his daughter have joined a gym.  No Known Allergies   Current Outpatient Medications  Medication Sig Dispense Refill  . acetaminophen (TYLENOL) 500 MG tablet Take 1,000 mg by mouth as needed for headache (pain).     . Multiple Vitamin (MULTIVITAMIN WITH MINERALS) TABS tablet Take 1 tablet by mouth every morning.    . rivaroxaban (XARELTO) 20 MG TABS tablet Take 1 tablet (20 mg total) by mouth daily with supper. 30 tablet 3   No current facility-administered medications for this visit.     Past Medical History:  Diagnosis Date  . Atrial fibrillation ([place])   . Atrial flutter ([place])     ROS:   All systems reviewed and negative except as noted in the HPI.   Past Surgical History:  Procedure Laterality Date  . A-FLUTTER ABLATION N/A 04/17/2020   Procedure: A-FLUTTER ABLATION;  Surgeon: Evans Lance, MD;  Location: [place] [facility];  Service: Cardiovascular;  Laterality: N/A;  . arm surgery Left      History reviewed. No pertinent family history.   Social History   Socioeconomic History  . Marital status: Single    Spouse name: Not on file  . Number of children: Not on file  . Years of education: Not on file  . Highest education level: Not on file  Occupational History  . Not on file  Tobacco Use  . Smoking status: Current Every Day Smoker    Packs/day: 0.50    Types: Cigarettes  . Smokeless tobacco: Never Used  Substance and Sexual Activity  . Alcohol use: Not Currently  . Drug use: Never  . Sexual activity: Not on file  Other Topics Concern  . Not on file  Social History Narrative  . Not on file   Social Determinants of Health   Financial Resource Strain:   . Difficulty of Paying Living Expenses:    Food Insecurity:   . Worried About Charity fundraiser in the Last Year:   . Arboriculturist in the Last Year:   Transportation Needs:   . Film/video editor (Medical):   [name] Lack of Transportation (Non-Medical):   Physical Activity:   . Days of Exercise per Week:   . Minutes of Exercise per Session:   Stress:   . Feeling of Stress :   Social Connections:   . Frequency of Communication with Friends and Family:   . Frequency of Social Gatherings with Friends and Family:   . Attends Religious Services:   . Active Member of Clubs or Organizations:   . Attends Archivist Meetings:   [name] Marital Status:   Intimate Partner Violence:   . Fear of Current or Ex-Partner:   . Emotionally Abused:   [name] Physically Abused:   . Sexually Abused:      BP 116/66   Pulse 76   Ht 5\' 10"  (1.778 m)   Wt (!) 340 lb (154.2 kg)   SpO2 97%   BMI 48.78 kg/m   Physical Exam:  Well appearing NAD HEENT: Unremarkable Neck:  No JVD, no thyromegally Lymphatics:  No adenopathy Back:  No CVA tenderness Lungs:  Clear with no wheezes HEART:  Regular rate rhythm, no  murmurs, no rubs, no clicks Abd:  soft, positive bowel sounds, no organomegally, no rebound, no guarding Ext:  2 plus pulses, no edema, no cyanosis, no clubbing Skin:  No rashes no nodules Neuro:  CN II through XII intact, motor grossly intact  EKG - NSR  Assess/Plan: 1. Atrial flutter - he is s/p ablation 2. Atrial fib - I can find no evidence of sustained atrial fib. While he is at risk, I have recommended he stop his xarelto. If he develops sustained atrial fib then he will need to restart the xarelto. 3. Morbid obesity - I have strongly encouraged the patient to lose weight.  4. HTN - his bp is well controlled today. Continue current meds.   Leonia Reeves.D.

## 2020-06-21 ENCOUNTER — Encounter (HOSPITAL_COMMUNITY): Payer: Self-pay

## 2020-06-21 ENCOUNTER — Emergency Department (HOSPITAL_BASED_OUTPATIENT_CLINIC_OR_DEPARTMENT_OTHER)
Admission: EM | Admit: 2020-06-21 | Discharge: 2020-06-21 | Disposition: A | Discharge status: Home / Self Care | Payer: BC Managed Care – PPO | Source: Home / Self Care | Attending: Emergency Medicine | Admitting: Emergency Medicine

## 2020-06-21 ENCOUNTER — Emergency Department (HOSPITAL_COMMUNITY)
Admission: EM | Admit: 2020-06-21 | Discharge: 2020-06-21 | Disposition: A | Discharge status: Home / Self Care | Payer: BC Managed Care – PPO | Attending: Emergency Medicine | Admitting: Emergency Medicine

## 2020-06-21 ENCOUNTER — Other Ambulatory Visit: Payer: Self-pay

## 2020-06-21 ENCOUNTER — Encounter (HOSPITAL_BASED_OUTPATIENT_CLINIC_OR_DEPARTMENT_OTHER): Payer: Self-pay

## 2020-06-21 DIAGNOSIS — Z79891 Long term (current) use of opiate analgesic: Secondary | ICD-10-CM | POA: Insufficient documentation

## 2020-06-21 DIAGNOSIS — R112 Nausea with vomiting, unspecified: Secondary | ICD-10-CM | POA: Insufficient documentation

## 2020-06-21 DIAGNOSIS — I4891 Unspecified atrial fibrillation: Secondary | ICD-10-CM | POA: Insufficient documentation

## 2020-06-21 DIAGNOSIS — R197 Diarrhea, unspecified: Secondary | ICD-10-CM | POA: Insufficient documentation

## 2020-06-21 DIAGNOSIS — Z87891 Personal history of nicotine dependence: Secondary | ICD-10-CM | POA: Insufficient documentation

## 2020-06-21 LAB — COMPREHENSIVE METABOLIC PANEL
ALT: 25 U/L (ref 0–44)
AST: 22 U/L (ref 15–41)
Albumin: 3.8 g/dL (ref 3.5–5.0)
Alkaline Phosphatase: 58 U/L (ref 38–126)
Anion gap: 9 (ref 5–15)
BUN: 13 mg/dL (ref 6–20)
CO2: 23 mmol/L (ref 22–32)
Calcium: 8.9 mg/dL (ref 8.9–10.3)
Chloride: 105 mmol/L (ref 98–111)
Creatinine, Ser: 0.85 mg/dL (ref 0.61–1.24)
GFR calc Af Amer: >60 mL/min (ref >60)
GFR calc non Af Amer: >60 mL/min (ref >60)
Glucose, Bld: 105 mg/dL — ABNORMAL HIGH (ref 70–99)
Potassium: 3.6 mmol/L (ref 3.5–5.1)
Sodium: 137 mmol/L (ref 135–145)
Total Bilirubin: 0.8 mg/dL (ref 0.3–1.2)
Total Protein: 7.2 g/dL (ref 6.5–8.1)

## 2020-06-21 LAB — URINALYSIS, ROUTINE W REFLEX MICROSCOPIC
Bilirubin Urine: NEGATIVE
Glucose, UA: NEGATIVE mg/dL
Hgb urine dipstick: NEGATIVE
Ketones, ur: NEGATIVE mg/dL
Leukocytes,Ua: NEGATIVE
Nitrite: NEGATIVE
Protein, ur: 30 mg/dL — AB
Specific Gravity, Urine: 1.032 — ABNORMAL HIGH (ref 1.005–1.030)
pH: 5 (ref 5.0–8.0)

## 2020-06-21 LAB — CBC
HCT: 40.7 % (ref 39.0–52.0)
Hemoglobin: 13.8 g/dL (ref 13.0–17.0)
MCH: 33.3 pg (ref 26.0–34.0)
MCHC: 33.9 g/dL (ref 30.0–36.0)
MCV: 98.1 fL (ref 80.0–100.0)
Platelets: 145 10*3/uL — ABNORMAL LOW (ref 150–400)
RBC: 4.15 MIL/uL — ABNORMAL LOW (ref 4.22–5.81)
RDW: 12.8 % (ref 11.5–15.5)
WBC: 3.5 10*3/uL — ABNORMAL LOW (ref 4.0–10.5)
nRBC: 0 % (ref 0.0–0.2)

## 2020-06-21 LAB — LIPASE, BLOOD: Lipase: 27 U/L (ref 11–51)

## 2020-06-21 MED ORDER — SODIUM CHLORIDE 0.9% FLUSH: 3.0000 mL | Freq: Once | INTRAVENOUS | Status: DC

## 2020-06-21 MED ORDER — LOPERAMIDE HCL 2 MG PO CAPS
4.0000 mg | ORAL_CAPSULE | Freq: Once | ORAL | Status: AC
  Administered 2020-06-21: 4 mg via ORAL
  Filled 2020-06-21: qty 2

## 2020-06-21 NOTE — ED Triage Notes (Signed)
Pt reports that he has been having n/v/d since yesterday, denies fevers or abd pain

## 2020-06-21 NOTE — ED Provider Notes (Signed)
[facility] EMERGENCY DEPARTMENT Provider Note   CSN: [id number] Arrival date & time: 06/21/20  1042     History Chief Complaint  Patient presents with  . Emesis    Russell [name] is a 44 y.o. male.  44 yo M with a chief complaints of nausea vomiting and diarrhea.  Going on for about 48 hours.  Had nausea and vomiting yesterday which is improved now having profuse diarrhea.  Was unable to make it from his house to his car.  Ended up calling and getting at work today.  Denies abdominal pain denies fevers.  States that his stool is green color.  He denies bloody or bilious emesis denies dark or bloody stool.  The history is provided by the patient.  Emesis Associated symptoms: no abdominal pain, no arthralgias, no chills, no diarrhea, no fever, no headaches and no myalgias   Illness Severity:  Moderate Onset quality:  Gradual Duration:  2 days Timing:  Constant Progression:  Partially resolved Chronicity:  New Associated symptoms: vomiting   Associated symptoms: no abdominal pain, no chest pain, no congestion, no diarrhea, no fever, no headaches, no myalgias, no rash and no shortness of breath        Past Medical History:  Diagnosis Date  . Atrial fibrillation (HCC)   . Atrial flutter [facility])     Patient Active Problem List   Diagnosis Date Noted  . Typical atrial flutter (HCC) 03/09/2020    Past Surgical History:  Procedure Laterality Date  . A-FLUTTER ABLATION N/A 04/17/2020   Procedure: A-FLUTTER ABLATION;  Surgeon: Marinus Maw, MD;  Location: [facility] INVASIVE CV LAB;  Service: Cardiovascular;  Laterality: N/A;  . arm surgery Left        No family history on file.  Social History   Tobacco Use  . Smoking status: Former Smoker    Packs/day: 0.00  . Smokeless tobacco: Never Used  Vaping Use  . Vaping Use: Never used  Substance Use Topics  . Alcohol use: Yes    Comment: occ  . Drug use: Yes    Types: Marijuana    Home Medications Prior to  Admission medications   Medication Sig Start Date End Date Taking? Authorizing Provider  acetaminophen (TYLENOL) 500 MG tablet Take 1,000 mg by mouth as needed for headache (pain).     [provider]  Multiple Vitamin (MULTIVITAMIN WITH MINERALS) TABS tablet Take 1 tablet by mouth every morning.    [provider]  apixaban (ELIQUIS) 5 MG TABS tablet Take 1 tablet (5 mg total) by mouth 2 (two) times daily for 30 days. Patient not taking: Reported on 10/03/2019 06/18/19 03/02/20  Gerhard Munch, MD    Allergies    Patient has no known allergies.  Review of Systems   Review of Systems  Constitutional: Negative for chills and fever.  HENT: Negative for congestion and facial swelling.   Eyes: Negative for discharge and visual disturbance.  Respiratory: Negative for shortness of breath.   Cardiovascular: Negative for chest pain and palpitations.  Gastrointestinal: Positive for vomiting. Negative for abdominal pain and diarrhea.  Musculoskeletal: Negative for arthralgias and myalgias.  Skin: Negative for color change and rash.  Neurological: Negative for tremors, syncope and headaches.  Psychiatric/Behavioral: Negative for confusion and dysphoric mood.    Physical Exam Updated Vital Signs BP (!) 112/96 (BP Location: Right Wrist)   Pulse 62   Temp 98.6 F (37 C) (Oral)   Resp 15   Ht 5\' 10"  (  1.778 m)   Wt (!) 148.9 kg   SpO2 100%   BMI 47.09 kg/m   Physical Exam Vitals and nursing note reviewed.  Constitutional:      Appearance: He is well-developed. He is obese.  HENT:     Head: Normocephalic and atraumatic.  Eyes:     Pupils: Pupils are equal, round, and reactive to light.  Neck:     Vascular: No JVD.  Cardiovascular:     Rate and Rhythm: Normal rate and regular rhythm.     Heart sounds: No murmur heard.  No friction rub. No gallop.   Pulmonary:     Effort: No respiratory distress.     Breath sounds: No wheezing.  Abdominal:     General: There is no  distension.     Tenderness: There is no abdominal tenderness. There is no guarding or rebound.     Comments: Benign abdominal exam.  Musculoskeletal:        General: Normal range of motion.     Cervical back: Normal range of motion and neck supple.  Skin:    Coloration: Skin is not pale.     Findings: No rash.  Neurological:     Mental Status: He is alert and oriented to person, place, and time.  Psychiatric:        Behavior: Behavior normal.     ED Results / Procedures / Treatments   Labs (all labs ordered are listed, but only abnormal results are displayed) Labs Reviewed - No data to display  EKG None  Radiology No results found.  Procedures Procedures (including critical care time)  Medications Ordered in ED Medications  loperamide (IMODIUM) capsule 4 mg (4 mg Oral Given 06/21/20 1354)    ED Course  I have reviewed the triage vital signs and the nursing notes.  Pertinent labs & imaging results that were available during my care of the patient were reviewed by me and considered in my medical decision making (see chart for details).    MDM Rules/Calculators/A&P                          44 yo M with a chief complaint of nausea vomiting and diarrhea.  Going on for about 48 hours.  2 family members with the same illness.  Benign abdominal exam for me.  Had lab work done at [facility] earlier this morning.  His lab work was reviewed no significant LFT elevation lipase is normal.  Will discharge the patient home.  PCP follow-up.  2:10 PM:  I have discussed the diagnosis/risks/treatment options with the patient and believe the pt to be eligible for discharge home to follow-up with PCP. We also discussed returning to the ED immediately if new or worsening sx occur. We discussed the sx which are most concerning (e.g., sudden worsening pain, fever, inability to tolerate by mouth) that necessitate immediate return. Medications administered to the patient during their visit and  any new prescriptions provided to the patient are listed below.  Medications given during this visit Medications  loperamide (IMODIUM) capsule 4 mg (4 mg Oral Given 06/21/20 1354)     The patient appears reasonably screen and/or stabilized for discharge and I doubt any other medical condition or other [facility] requiring further screening, evaluation, or treatment in the ED at this time prior to discharge.   Final Clinical Impression(s) / ED Diagnoses Final diagnoses:  Nausea vomiting and diarrhea    Rx / DC  Orders ED Discharge Orders    None       Deno Etienne, [place] 06/21/20 1410

## 2020-06-21 NOTE — ED Triage Notes (Signed)
Pt c/o n/v/d x 2 days-family member x 2 with same sx-NAD-steady gait

## 2020-06-21 NOTE — Discharge Instructions (Signed)
Try Imodium.  Follow the instructions on the bottle.  Please return for worsening abdominal pain inability to eat or drink or if you redevelop a fever.

## 2020-08-15 ENCOUNTER — Other Ambulatory Visit: Payer: Self-pay

## 2020-08-15 ENCOUNTER — Emergency Department (HOSPITAL_BASED_OUTPATIENT_CLINIC_OR_DEPARTMENT_OTHER): Payer: BC Managed Care – PPO

## 2020-08-15 ENCOUNTER — Emergency Department (HOSPITAL_BASED_OUTPATIENT_CLINIC_OR_DEPARTMENT_OTHER)
Admission: EM | Admit: 2020-08-15 | Discharge: 2020-08-15 | Disposition: A | Discharge status: Home / Self Care | Payer: BC Managed Care – PPO | Attending: Emergency Medicine | Admitting: Emergency Medicine

## 2020-08-15 ENCOUNTER — Encounter (HOSPITAL_BASED_OUTPATIENT_CLINIC_OR_DEPARTMENT_OTHER): Payer: Self-pay

## 2020-08-15 DIAGNOSIS — R05 Cough: Secondary | ICD-10-CM | POA: Diagnosis not present

## 2020-08-15 DIAGNOSIS — Z87891 Personal history of nicotine dependence: Secondary | ICD-10-CM | POA: Insufficient documentation

## 2020-08-15 DIAGNOSIS — R002 Palpitations: Secondary | ICD-10-CM | POA: Diagnosis not present

## 2020-08-15 DIAGNOSIS — I4891 Unspecified atrial fibrillation: Secondary | ICD-10-CM | POA: Insufficient documentation

## 2020-08-15 LAB — BASIC METABOLIC PANEL
Anion gap: 8 (ref 5–15)
BUN: 16 mg/dL (ref 6–20)
CO2: 25 mmol/L (ref 22–32)
Calcium: 9.3 mg/dL (ref 8.9–10.3)
Chloride: 105 mmol/L (ref 98–111)
Creatinine, Ser: 0.67 mg/dL (ref 0.61–1.24)
GFR calc Af Amer: >60 mL/min (ref >60)
GFR calc non Af Amer: >60 mL/min (ref >60)
Glucose, Bld: 97 mg/dL (ref 70–99)
Potassium: 4.1 mmol/L (ref 3.5–5.1)
Sodium: 138 mmol/L (ref 135–145)

## 2020-08-15 LAB — CBC
HCT: 40.5 % (ref 39.0–52.0)
Hemoglobin: 13.9 g/dL (ref 13.0–17.0)
MCH: 32.7 pg (ref 26.0–34.0)
MCHC: 34.3 g/dL (ref 30.0–36.0)
MCV: 95.3 fL (ref 80.0–100.0)
Platelets: 167 10*3/uL (ref 150–400)
RBC: 4.25 MIL/uL (ref 4.22–5.81)
RDW: 12.3 % (ref 11.5–15.5)
WBC: 6.4 10*3/uL (ref 4.0–10.5)
nRBC: 0 % (ref 0.0–0.2)

## 2020-08-15 LAB — TROPONIN I (HIGH SENSITIVITY)
Troponin I (High Sensitivity): 4 ng/L (ref <18)
Troponin I (High Sensitivity): 4 ng/L (ref <18)

## 2020-08-15 MED ORDER — RIVAROXABAN 20 MG PO TABS
20.0000 mg | ORAL_TABLET | Freq: Every day | ORAL | 0 refills | Status: DC
Start: 2020-08-15 — End: 2020-12-04

## 2020-08-15 MED ORDER — PROPOFOL 10 MG/ML IV BOLUS
INTRAVENOUS | Status: AC | PRN
  Administered 2020-08-15: 20 mg via INTRAVENOUS
  Administered 2020-08-15: 10 mg via INTRAVENOUS
  Administered 2020-08-15: 20 mg via INTRAVENOUS
  Administered 2020-08-15: 25 mg via INTRAVENOUS

## 2020-08-15 MED ORDER — PROPOFOL 10 MG/ML IV BOLUS
75.0000 mg | Freq: Once | INTRAVENOUS | Status: AC
  Administered 2020-08-15: 75 mg via INTRAVENOUS
  Filled 2020-08-15: qty 20

## 2020-08-15 MED FILL — XARELTO 20 MG TABLET: 20 | 30 days supply | Qty: 30 | Fill #0

## 2020-08-15 NOTE — ED Provider Notes (Addendum)
[facility] EMERGENCY DEPARTMENT Provider Note   CSN: [id number] Arrival date & time: 08/15/20  0932     History Chief Complaint  Patient presents with  . Palpitations    Russell [name] is a 44 y.o. male.  44 yo M with a cc of atrial fibrillation.  This started last night.  Started suddenly.  Patient has had a history of this.  Had an ablation a couple months ago and since then has not had any symptoms.  He denies any recent illness denies any decreased oral intake denies nausea vomiting or diarrhea denies cough congestion or fever.  Denies any new stimulants or increase stimulant use.  He smokes marijuana occasionally but not recently.  The history is provided by the patient.  Palpitations Palpitations quality:  Irregular Onset quality:  Sudden Duration:  8 hours Timing:  Constant Progression:  Unchanged Chronicity:  Recurrent Relieved by:  Nothing Worsened by:  Nothing Ineffective treatments:  None tried Associated symptoms: no chest pain, no shortness of breath and no vomiting        Past Medical History:  Diagnosis Date  . Atrial fibrillation (HCC)   . Atrial flutter [facility])     Patient Active Problem List   Diagnosis Date Noted  . Typical atrial flutter (HCC) 03/09/2020    Past Surgical History:  Procedure Laterality Date  . A-FLUTTER ABLATION N/A 04/17/2020   Procedure: A-FLUTTER ABLATION;  Surgeon: Marinus Maw, MD;  Location: [facility] INVASIVE CV LAB;  Service: Cardiovascular;  Laterality: N/A;  . arm surgery Left        No family history on file.  Social History   Tobacco Use  . Smoking status: Former Smoker    Packs/day: 0.00  . Smokeless tobacco: Never Used  Vaping Use  . Vaping Use: Never used  Substance Use Topics  . Alcohol use: Yes    Comment: occ  . Drug use: Yes    Types: Marijuana    Home Medications Prior to Admission medications   Medication Sig Start Date End Date Taking? Authorizing Provider  acetaminophen (TYLENOL)  500 MG tablet Take 1,000 mg by mouth as needed for headache (pain).     [provider]  Multiple Vitamin (MULTIVITAMIN WITH MINERALS) TABS tablet Take 1 tablet by mouth every morning.    [provider]  rivaroxaban (XARELTO) 20 MG TABS tablet Take 1 tablet (20 mg total) by mouth daily with supper. 08/15/20   Melene Plan, DO  apixaban (ELIQUIS) 5 MG TABS tablet Take 1 tablet (5 mg total) by mouth 2 (two) times daily for 30 days. Patient not taking: Reported on 10/03/2019 06/18/19 03/02/20  Gerhard Munch, MD    Allergies    Patient has no known allergies.  Review of Systems   Review of Systems  Constitutional: Negative for chills and fever.  HENT: Negative for congestion and facial swelling.   Eyes: Negative for discharge and visual disturbance.  Respiratory: Negative for shortness of breath.   Cardiovascular: Positive for palpitations. Negative for chest pain.  Gastrointestinal: Negative for abdominal pain, diarrhea and vomiting.  Musculoskeletal: Negative for arthralgias and myalgias.  Skin: Negative for color change and rash.  Neurological: Negative for tremors, syncope and headaches.  Psychiatric/Behavioral: Negative for confusion and dysphoric mood.    Physical Exam Updated Vital Signs BP 109/81   Pulse 68   Temp 98.2 F (36.8 C) (Oral)   Resp (!) 24   Ht 5\' 10"  (1.778 m)   Wt (!) 145.6  kg   SpO2 100%   BMI 46.06 kg/m   Physical Exam Vitals and nursing note reviewed.  Constitutional:      Appearance: He is well-developed. He is obese.  HENT:     Head: Normocephalic and atraumatic.  Eyes:     Pupils: Pupils are equal, round, and reactive to light.  Neck:     Vascular: No JVD.  Cardiovascular:     Rate and Rhythm: Normal rate and regular rhythm.     Heart sounds: No murmur heard.  No friction rub. No gallop.   Pulmonary:     Effort: No respiratory distress.     Breath sounds: No wheezing.  Abdominal:     General: There is no distension.      Tenderness: There is no guarding or rebound.  Musculoskeletal:        General: Normal range of motion.     Cervical back: Normal range of motion and neck supple.  Skin:    Coloration: Skin is not pale.     Findings: No rash.  Neurological:     Mental Status: He is alert and oriented to person, place, and time.  Psychiatric:        Behavior: Behavior normal.     ED Results / Procedures / Treatments   Labs (all labs ordered are listed, but only abnormal results are displayed) Labs Reviewed  BASIC METABOLIC PANEL  CBC  TROPONIN I (HIGH SENSITIVITY)  TROPONIN I (HIGH SENSITIVITY)    EKG EKG Interpretation  Date/Time:  Tuesday August 15 2020 08:28:46 EDT Ventricular Rate:  76 PR Interval:    QRS Duration: 92 QT Interval:  380 QTC Calculation: 427 R Axis:   64 Text Interpretation: Atrial fibrillation Abnormal ECG Otherwise no significant change Confirmed by Melene Plan [phone]) on 08/15/2020 8:33:39 AM   Radiology DG Chest 2 View  Result Date: 08/15/2020 CLINICAL DATA:  44 year old male with palpitations, after coughing. Cardiac ablation 2 months ago. EXAM: CHEST - 2 VIEW COMPARISON:  Chest radiographs 03/02/2020 and earlier. FINDINGS: Lung volumes and mediastinal contours remain normal. Visualized tracheal air column is within normal limits. No pneumothorax, pulmonary edema or pleural effusion. No acute or confluent pulmonary opacity identified. No acute osseous abnormality identified. Negative visible bowel gas pattern. IMPRESSION: No acute cardiopulmonary abnormality. Electronically Signed   By: Odessa Fleming M.D.   On: 08/15/2020 08:55    Procedures .Cardioversion  Date/Time: 08/15/2020 1:45 PM Performed by: Melene Plan, DO Authorized by: Melene Plan, DO   Consent:    Consent obtained:  Written   Consent given by:  Patient   Risks discussed:  Cutaneous burn, death, induced arrhythmia and pain   Alternatives discussed:  No treatment, rate-control medication and  observation Pre-procedure details:    Cardioversion basis:  Elective   Rhythm:  Atrial fibrillation   Electrode placement:  Anterior-posterior Patient sedated: Yes. Refer to sedation procedure documentation for details of sedation.  Attempt one:    Cardioversion mode:  Synchronous   Waveform:  Biphasic   Shock (Joules):  200   Shock outcome:  Conversion to normal sinus rhythm Post-procedure details:    Patient status:  Awake   Patient tolerance of procedure:  Tolerated well, no immediate complications .Sedation  Date/Time: 08/15/2020 1:46 PM Performed by: Melene Plan, DO Authorized by: Melene Plan, DO   Consent:    Consent obtained:  Verbal   Consent given by:  Patient   Risks discussed:  Allergic reaction, dysrhythmia, inadequate sedation, nausea, prolonged hypoxia  resulting in organ damage, prolonged sedation necessitating reversal, respiratory compromise necessitating ventilatory assistance and intubation and vomiting   Alternatives discussed:  Analgesia without sedation and anxiolysis Universal protocol:    Procedure explained and questions answered to patient or proxy's satisfaction: yes     Relevant documents present and verified: yes     Test results available and properly labeled: yes     Imaging studies available: yes     Required blood products, implants, devices, and special equipment available: yes     Site/side marked: yes     Immediately prior to procedure a time out was called: yes     Patient identity confirmation method:  Verbally with patient, arm band and hospital-assigned identification number Indications:    Procedure performed:  Cardioversion   Procedure necessitating sedation performed by:  Physician performing sedation Pre-sedation assessment:    Time since last food or drink:  6   ASA classification: class 1 - normal, healthy patient     Neck mobility: normal     Mouth opening:  2 finger widths   Thyromental distance:  3 finger widths   Mallampati  score:  II - soft palate, uvula, fauces visible   Pre-sedation assessments completed and reviewed: airway patency, cardiovascular function, hydration status, mental status, nausea/vomiting, pain level, respiratory function and temperature   Immediate pre-procedure details:    Reassessment: Patient reassessed immediately prior to procedure     Reviewed: vital signs, relevant labs/tests and NPO status     Verified: bag valve mask available, emergency equipment available, intubation equipment available, IV patency confirmed, oxygen available and suction available   Procedure details (see MAR for exact dosages):    Preoxygenation:  Nasal cannula   Sedation:  Propofol   Intended level of sedation: deep   Analgesia:  None   Intra-procedure monitoring:  Blood pressure monitoring, cardiac monitor, continuous pulse oximetry, frequent LOC assessments, frequent vital sign checks and continuous capnometry   Intra-procedure events: none     Total Provider sedation time (minutes):  35 Post-procedure details:    Attendance: Constant attendance by certified staff until patient recovered     Recovery: Patient returned to pre-procedure baseline     Post-sedation assessments completed and reviewed: airway patency, cardiovascular function, hydration status, mental status, nausea/vomiting, pain level, respiratory function and temperature     Patient is stable for discharge or admission: yes     Patient tolerance:  Tolerated well, no immediate complications   (including critical care time)  Medications Ordered in ED Medications  propofol (DIPRIVAN) 10 mg/mL bolus/IV push (20 mg Intravenous Given 08/15/20 1339)  propofol (DIPRIVAN) 10 mg/mL bolus/IV push 75 mg (75 mg Intravenous Given 08/15/20 1331)    ED Course  I have reviewed the triage vital signs and the nursing notes.  Pertinent labs & imaging results that were available during my care of the patient were reviewed by me and considered in my medical  decision making (see chart for details).    MDM Rules/Calculators/A&P                          44 yo M with a chief complaint of palpitations.  Patient has a history of paroxysmal atrial fibrillation and status post ablation a couple weeks ago.  The patient felt like his heart started beating irregular last night and has persisted into this morning.  He is irregularly irregular on exam and appears to be in A. fib on the monitor.  I discussed with the patient about sedation and electrical cardioversion.  He denies any current symptoms for other illness.  No significant electrolyte abnormality.  No significant anemia.  Cardioverted without issue.  Patient now had normal sinus with some ectopy.  We will have him restart his Xarelto.  Follow-up with his cardiologist in the office.  CRITICAL CARE Performed by: Rae Roamaniel Patrick Emeterio Balke   Total critical care time: 35 minutes  Critical care time was exclusive of separately billable procedures and treating other patients.  Critical care was necessary to treat or prevent imminent or life-threatening deterioration.  Critical care was time spent personally by me on the following activities: development of treatment plan with patient and/or surrogate as well as nursing, discussions with consultants, evaluation of patient's response to treatment, examination of patient, obtaining history from patient or surrogate, ordering and performing treatments and interventions, ordering and review of laboratory studies, ordering and review of radiographic studies, pulse oximetry and re-evaluation of patient's condition.   CHA2DS2/VAS Stroke Risk Points  Current as of 13 minutes ago     0 >= 2 Points: High Risk  1 - 1.99 Points: Medium Risk  0 Points: Low Risk    No Change      Details    This score determines the patient's risk of having a stroke if the  patient has atrial fibrillation.       Points Metrics  0 Has Congestive Heart Failure:  No    Current as  of 13 minutes ago  0 Has Vascular Disease:  No    Current as of 13 minutes ago  0 Has Hypertension:  No    Current as of 13 minutes ago  0 Age:  [age]    Current as of 13 minutes ago  0 Has Diabetes:  No    Current as of 13 minutes ago  0 Had Stroke:  No  Had TIA:  No  Had thromboembolism:  No    Current as of 13 minutes ago  0 Male:  No    Current as of 13 minutes ago          2:21 PM:  I have discussed the diagnosis/risks/treatment options with the patient and believe the pt to be eligible for discharge home to follow-up with Cards. We also discussed returning to the ED immediately if new or worsening sx occur. We discussed the sx which are most concerning (e.g., recurrent palpitations, stroke s/sx) that necessitate immediate return. Medications administered to the patient during their visit and any new prescriptions provided to the patient are listed below.  Medications given during this visit Medications  propofol (DIPRIVAN) 10 mg/mL bolus/IV push (20 mg Intravenous Given 08/15/20 1339)  propofol (DIPRIVAN) 10 mg/mL bolus/IV push 75 mg (75 mg Intravenous Given 08/15/20 1331)     The patient appears reasonably screen and/or stabilized for discharge and I doubt any other medical condition or other [facility] requiring further screening, evaluation, or treatment in the ED at this time prior to discharge.   Final Clinical Impression(s) / ED Diagnoses Final diagnoses:  Atrial fibrillation with RVR (HCC)    Rx / DC Orders ED Discharge Orders         Ordered    rivaroxaban (XARELTO) 20 MG TABS tablet  Daily with supper     Discontinue  Reprint     08/15/20 1416           Melene PlanFloyd, Tanaiya Kolarik, DO 08/15/20 1421    Melene PlanFloyd, Jaszmine Navejas, DO 08/23/20  0732  

## 2020-08-15 NOTE — Sedation Documentation (Signed)
Unable to assess pain r/t procedure 

## 2020-08-15 NOTE — ED Notes (Signed)
Discharged home with daughter.

## 2020-08-15 NOTE — ED Triage Notes (Signed)
Pt arrives with c/o fluttering in his chest starting last night pt reports history of A Flutter with recent ablation about 2 moths ago. States no issues since then, was sitting on the cough last night and felt it start "pounding".

## 2020-08-15 NOTE — Discharge Instructions (Signed)
Follow up with your cardiologist or the afib clinic.  Return for recurrent symptoms.

## 2020-08-15 NOTE — ED Notes (Signed)
Unable to assess pain r/t procedure

## 2020-08-16 ENCOUNTER — Telehealth (HOSPITAL_COMMUNITY): Payer: Self-pay

## 2020-08-16 NOTE — Telephone Encounter (Signed)
Patient was contacted by phone. He is scheduled for a follow up from the ED. I scheduled his appointment on August 24th @ 9:00am to see Eyvonne Left at the A-fib clinic. Patient was given directions, parking code for August and our phone number to contact us if he has problems finding the clinic. Consulted with patient and he verbalized understanding.

## 2020-08-17 ENCOUNTER — Encounter (HOSPITAL_COMMUNITY): Payer: Self-pay

## 2020-08-17 ENCOUNTER — Emergency Department (HOSPITAL_BASED_OUTPATIENT_CLINIC_OR_DEPARTMENT_OTHER)
Admit: 2020-08-17 | Discharge: 2020-08-17 | Disposition: A | Discharge status: Home / Self Care | Payer: BC Managed Care – PPO | Attending: Physician Assistant | Admitting: Physician Assistant

## 2020-08-17 ENCOUNTER — Telehealth: Payer: Self-pay | Admitting: Internal Medicine

## 2020-08-17 ENCOUNTER — Other Ambulatory Visit: Payer: Self-pay

## 2020-08-17 ENCOUNTER — Emergency Department (HOSPITAL_COMMUNITY)
Admission: EM | Admit: 2020-08-17 | Discharge: 2020-08-17 | Disposition: A | Discharge status: Home / Self Care | Payer: BC Managed Care – PPO | Attending: Emergency Medicine | Admitting: Emergency Medicine

## 2020-08-17 VITALS — BP 138/86 | HR 63 | Ht 70.0 in | Wt 323.6 lb

## 2020-08-17 DIAGNOSIS — D6869 Other thrombophilia: Secondary | ICD-10-CM | POA: Diagnosis not present

## 2020-08-17 DIAGNOSIS — Z5321 Procedure and treatment not carried out due to patient leaving prior to being seen by health care provider: Secondary | ICD-10-CM | POA: Insufficient documentation

## 2020-08-17 DIAGNOSIS — I483 Typical atrial flutter: Secondary | ICD-10-CM | POA: Diagnosis not present

## 2020-08-17 DIAGNOSIS — I480 Paroxysmal atrial fibrillation: Secondary | ICD-10-CM | POA: Diagnosis not present

## 2020-08-17 DIAGNOSIS — I4891 Unspecified atrial fibrillation: Secondary | ICD-10-CM | POA: Diagnosis not present

## 2020-08-17 MED ORDER — MULTAQ 400 MG PO TABS: 400.0000 mg | ORAL_TABLET | Freq: Two times a day (BID) | ORAL | 3 refills | Status: DC

## 2020-08-17 MED FILL — MULTAQ 400 MG TABLET: 400 | 30 days supply | Qty: 60 | Fill #0

## 2020-08-17 NOTE — Telephone Encounter (Signed)
Pt c/o medication issue:  1. Name of Medication: Carvedilol or Metoprolol Tartrate  2. How are you currently taking this medication (dosage and times per day)? Not taking  3. Are you having a reaction (difficulty breathing--STAT)? no  4. What is your medication issue? Patient's father went into afib on Monday - he is following up with Clint Fenton on 08/22/20. Dorathy Daft wants to know if Dr. Ladona Ridgel will prescribe either of the above medications until her father can see Clint. Please advise.

## 2020-08-17 NOTE — Patient Instructions (Addendum)
Start Multaq 400mg  twice a day WITH FOOD  Go to -- register for savings card

## 2020-08-17 NOTE — Telephone Encounter (Signed)
Patient is actually sitting in [facility] ER waiting room. HR in the 60s now and feeling improved. No chest pain/shortness of breath. Discussed with Jorja Loa - will bring pt into AF clinic this morning to adjust medications to help keep out of the ER if we can. Pt in agreement. Will see pt in clinic shortly.

## 2020-08-17 NOTE — ED Notes (Signed)
Pt stated the afib clinic here at [facility] is going to see him, security will assist hum to the clinic.

## 2020-08-17 NOTE — ED Triage Notes (Signed)
Patient complains of recent issues with atrial flutter. Has had ablation and cardioverted this week for atrial fib. Started on xarelto. States that he felt the fib start again yesterday. Weakness , denies pain

## 2020-08-17 NOTE — Progress Notes (Signed)
Primary Care Physician: Patient, No Pcp Per Primary Cardiologist: Dr Leeann Must Primary Electrophysiologist: Dr Ladona Ridgel Referring Physician: [facility]   Russell [name] is a 44 y.o. male with a history of atrial flutter and OSA who presents for consultation in the [facility].  The patient was initially diagnosed with atrial flutter in [place] 08/2017 and has had numerous cardioversions. Caffeine and Sprite appear to have been triggers for him. He has been on amiodarone in the past. Patient is not on long term anticoagulation with a CHADS2VASC score of 0. Patient has not followed up after his previous cardoversions due to a lack of insurance. He was seen in the ER on 03/02/20 with symptoms of palpitations and underwent successful DCCV. He was started on Xarelto. He denies any history of afib. He denies significant alcohol use. He has OSA but has not been on CPAP 2/2 cost.  On follow up today, patient is s/p atrial flutter ablation with Dr Ladona Ridgel on 04/17/20. He has subsequently developed atrial fibrillation and underwent DCCV in the ED 08/15/20. Early this morning he felt he was back in afib with symptoms of palpitations and SOB. He presented to the ED in rate controlled afib. He spontaneously converted in the waiting room and was brought to the AF clinic. There were no specific triggers he could identify. He denies bleeding issues on anticoagulation.   Today, he denies symptoms of chest pain, orthopnea, PND, lower extremity edema, dizziness, presyncope, syncope, bleeding, or neurologic sequela. The patient is tolerating medications without difficulties and is otherwise without complaint today.    Atrial Fibrillation Risk Factors:  he does have symptoms or diagnosis of sleep apnea. he is not compliant with CPAP therapy. he does not have a history of rheumatic fever. he does have a history of alcohol use. The patient does not have a history of early familial atrial  fibrillation or other arrhythmias.  he has a BMI of Body mass index is 46.43 kg/m.[name] Filed Weights   08/17/20 1023  Weight: (!) 146.8 kg    No family history on file.   Atrial Fibrillation Management history:  Previous antiarrhythmic drugs: amiodarone Previous cardioversions: several, 03/02/20, 08/15/20 Previous ablations: none CHADS2VASC score: 0 Anticoagulation history: Xarelto   Past Medical History:  Diagnosis Date  . Atrial fibrillation (HCC)   . Atrial flutter [facility])    Past Surgical History:  Procedure Laterality Date  . A-FLUTTER ABLATION N/A 04/17/2020   Procedure: A-FLUTTER ABLATION;  Surgeon: Marinus Maw, MD;  Location: [facility] INVASIVE CV LAB;  Service: Cardiovascular;  Laterality: N/A;  . arm surgery Left     Current Outpatient Medications  Medication Sig Dispense Refill  . acetaminophen (TYLENOL) 500 MG tablet Take 1,000 mg by mouth as needed for headache (pain).     . Multiple Vitamin (MULTIVITAMIN WITH MINERALS) TABS tablet Take 1 tablet by mouth every morning.    . rivaroxaban (XARELTO) 20 MG TABS tablet Take 1 tablet (20 mg total) by mouth daily with supper. 30 tablet 0  . dronedarone (MULTAQ) 400 MG tablet Take 1 tablet (400 mg total) by mouth 2 (two) times daily with a meal. 60 tablet 3   No current facility-administered medications for this encounter.    No Known Allergies  Social History   Socioeconomic History  . Marital status: Single    Spouse name: Not on file  . Number of children: Not on file  . Years of education: Not on file  . Highest  education level: Not on file  Occupational History  . Not on file  Tobacco Use  . Smoking status: Former Smoker    Packs/day: 0.00  . Smokeless tobacco: Never Used  Vaping Use  . Vaping Use: Never used  Substance and Sexual Activity  . Alcohol use: Yes    Comment: occ  . Drug use: Yes    Types: Marijuana  . Sexual activity: Not on file  Other Topics Concern  . Not on file  Social History  Narrative  . Not on file   Social Determinants of Health   Financial Resource Strain:   . Difficulty of Paying Living Expenses: Not on file  Food Insecurity:   . Worried About Programme researcher, broadcasting/film/video in the Last Year: Not on file  . Ran Out of Food in the Last Year: Not on file  Transportation Needs:   . Lack of Transportation (Medical): Not on file  . Lack of Transportation (Non-Medical): Not on file  Physical Activity:   . Days of Exercise per Week: Not on file  . Minutes of Exercise per Session: Not on file  Stress:   . Feeling of Stress : Not on file  Social Connections:   . Frequency of Communication with Friends and Family: Not on file  . Frequency of Social Gatherings with Friends and Family: Not on file  . Attends Religious Services: Not on file  . Active Member of Clubs or Organizations: Not on file  . Attends Banker Meetings: Not on file  . Marital Status: Not on file  Intimate Partner Violence:   . Fear of Current or Ex-Partner: Not on file  . Emotionally Abused: Not on file  . Physically Abused: Not on file  . Sexually Abused: Not on file     ROS- All systems are reviewed and negative except as per the HPI above.  Physical Exam: Vitals:   08/17/20 1023  BP: 138/86  Pulse: 63  Weight: (!) 146.8 kg  Height: 5\' 10"  (1.778 m)    GEN- The patient is well appearing obese male, alert and oriented x 3 today.   HEENT-head normocephalic, atraumatic, sclera clear, conjunctiva pink, hearing intact, trachea midline. Lungs- Clear to ausculation bilaterally, normal work of breathing Heart- Regular rate and rhythm, no murmurs, rubs or gallops  GI- soft, NT, ND, + BS Extremities- no clubbing, cyanosis, or edema MS- no significant deformity or atrophy Skin- no rash or lesion Psych- euthymic mood, full affect Neuro- strength and sensation are intact   Wt Readings from Last 3 Encounters:  08/17/20 (!) 145.6 kg  08/17/20 (!) 146.8 kg  08/15/20 (!) 145.6 kg      EKG today demonstrates SR HR 63, PR 182, QRS 88, QTc 417  Epic records are reviewed at length today  CHA2DS2-VASc Score = 0  The patient's score is based upon: CHF History: 0 HTN History: 0 Age : 0 Diabetes History: 0 Stroke History: 0 Vascular Disease History: 0 Gender: 0      ASSESSMENT AND PLAN: Paroxysmal Atrial Fibrillation/typical atrial flutter The patient's CHA2DS2-VASc score is 0, indicating a 0.2% annual risk of stroke.   S/p atrial flutter ablation 04/17/20 with Dr 04/19/20. S/p DCCV for afib on 08/15/20. We discussed therapeutic options today including AAD therapy. Will plan to start Multaq 400 mg BID Will check echocardiogram Continue Xarelto 20 mg daily.  2. Obesity Body mass index is 46.43 kg/m. Lifestyle modification was discussed and encouraged including regular physical activity and weight  reduction.  3. Obstructive sleep apnea The importance of adequate treatment of sleep apnea was discussed today in order to improve our ability to maintain sinus rhythm long term. Patient to call and reestablish care with pulmonology at [facility].    Follow up next week in AF clinic for ECG.   Jorja Loa PA-C Afib Clinic [facility] [address] [place], [place] [address] [phone] 08/17/2020 10:59 AM

## 2020-08-22 ENCOUNTER — Ambulatory Visit (HOSPITAL_COMMUNITY): Payer: BC Managed Care – PPO | Admitting: Physician Assistant

## 2020-08-24 ENCOUNTER — Encounter (HOSPITAL_COMMUNITY): Payer: Self-pay | Admitting: Physician Assistant

## 2020-08-24 ENCOUNTER — Other Ambulatory Visit: Payer: Self-pay

## 2020-08-24 ENCOUNTER — Ambulatory Visit (HOSPITAL_COMMUNITY): Admission: RE | Admit: 2020-08-24 | Payer: BC Managed Care – PPO | Source: Ambulatory Visit

## 2020-08-24 ENCOUNTER — Ambulatory Visit (HOSPITAL_COMMUNITY)
Admission: RE | Admit: 2020-08-24 | Discharge: 2020-08-24 | Disposition: A | Discharge status: Home / Self Care | Payer: BC Managed Care – PPO | Source: Ambulatory Visit | Attending: Physician Assistant | Admitting: Physician Assistant

## 2020-08-24 VITALS — BP 110/78 | HR 68 | Ht 70.0 in | Wt 325.6 lb

## 2020-08-24 DIAGNOSIS — I4892 Unspecified atrial flutter: Secondary | ICD-10-CM | POA: Insufficient documentation

## 2020-08-24 DIAGNOSIS — Z79899 Other long term (current) drug therapy: Secondary | ICD-10-CM | POA: Insufficient documentation

## 2020-08-24 DIAGNOSIS — I480 Paroxysmal atrial fibrillation: Secondary | ICD-10-CM | POA: Insufficient documentation

## 2020-08-24 DIAGNOSIS — G4733 Obstructive sleep apnea (adult) (pediatric): Secondary | ICD-10-CM | POA: Diagnosis not present

## 2020-08-24 DIAGNOSIS — Z87891 Personal history of nicotine dependence: Secondary | ICD-10-CM | POA: Insufficient documentation

## 2020-08-24 DIAGNOSIS — E669 Obesity, unspecified: Secondary | ICD-10-CM | POA: Diagnosis not present

## 2020-08-24 DIAGNOSIS — Z6841 Body Mass Index (BMI) 40.0 and over, adult: Secondary | ICD-10-CM | POA: Insufficient documentation

## 2020-08-24 NOTE — Progress Notes (Signed)
Primary Care Physician: Russell [name], No Pcp Per Primary Cardiologist: Dr Leeann Must Primary Electrophysiologist: Dr Ladona Ridgel Referring Physician: [facility]   Russell [name] is a 44 y.o. male with a history of atrial flutter and OSA who presents for consultation in the [facility].  The Russell [name] was initially diagnosed with atrial flutter in [place] 08/2017 and has had numerous cardioversions. Caffeine and Sprite appear to have been triggers for him. He has been on amiodarone in the past. Russell [name] is not on long term anticoagulation with a CHADS2VASC score of 0. Russell [name] has not followed up after his previous cardoversions due to a lack of insurance. He was seen in the ER on 03/02/20 with symptoms of palpitations and underwent successful DCCV. He was started on Xarelto. He denies any history of afib. He denies significant alcohol use. He has OSA but has not been on CPAP 2/2 cost.  Russell [name] is s/p atrial flutter ablation with Dr Ladona Ridgel on 04/17/20. He has subsequently developed atrial fibrillation and underwent DCCV in the ED 08/15/20. He felt he was back in afib with symptoms of palpitations and SOB on 08/17/20. He presented to the ED in rate controlled afib. He spontaneously converted in the waiting room and was brought to the AF clinic.   On follow up today, Russell [name] reports he has done well since his last visit. He is tolerating the Multaq with no side effects. He did have one episode of heart racing which lasted about 1 hour and then resolved. He denies bleeding issues on anticoagulation.   Today, he denies symptoms of chest pain, orthopnea, PND, lower extremity edema, dizziness, presyncope, syncope, bleeding, or neurologic sequela. The Russell [name] is tolerating medications without difficulties and is otherwise without complaint today.    Atrial Fibrillation Risk Factors:  he does have symptoms or diagnosis of sleep apnea. he is not compliant with CPAP therapy. he does not have a  history of rheumatic fever. he does have a history of alcohol use. The Russell [name] does not have a history of early familial atrial fibrillation or other arrhythmias.  he has a BMI of Body mass index is 46.72 kg/m.[name] Filed Weights   08/24/20 0904  Weight: (!) 147.7 kg    No family history on file.   Atrial Fibrillation Management history:  Previous antiarrhythmic drugs: amiodarone, Multaq Previous cardioversions: several, 03/02/20, 08/15/20 Previous ablations: none CHADS2VASC score: 0 Anticoagulation history: Xarelto   Past Medical History:  Diagnosis Date  . Atrial fibrillation (HCC)   . Atrial flutter [facility])    Past Surgical History:  Procedure Laterality Date  . A-FLUTTER ABLATION N/A 04/17/2020   Procedure: A-FLUTTER ABLATION;  Surgeon: Marinus Maw, MD;  Location: [facility] INVASIVE CV LAB;  Service: Cardiovascular;  Laterality: N/A;  . arm surgery Left     Current Outpatient Medications  Medication Sig Dispense Refill  . acetaminophen (TYLENOL) 500 MG tablet Take 1,000 mg by mouth as needed for headache (pain).     [name] dronedarone (MULTAQ) 400 MG tablet Take 1 tablet (400 mg total) by mouth 2 (two) times daily with a meal. 60 tablet 3  . Multiple Vitamin (MULTIVITAMIN WITH MINERALS) TABS tablet Take 1 tablet by mouth every morning.    . rivaroxaban (XARELTO) 20 MG TABS tablet Take 1 tablet (20 mg total) by mouth daily with supper. 30 tablet 0   No current facility-administered medications for this encounter.    No Known Allergies  Social History   Socioeconomic History  . Marital  status: Single    Spouse name: Not on file  . Number of children: Not on file  . Years of education: Not on file  . Highest education level: Not on file  Occupational History  . Not on file  Tobacco Use  . Smoking status: Former Smoker    Packs/day: 0.00  . Smokeless tobacco: Never Used  Vaping Use  . Vaping Use: Never used  Substance and Sexual Activity  . Alcohol use: Not Currently     Comment: occ  . Drug use: Yes    Types: Marijuana    Comment: once every couple weeks  . Sexual activity: Not on file  Other Topics Concern  . Not on file  Social History Narrative  . Not on file   Social Determinants of Health   Financial Resource Strain:   . Difficulty of Paying Living Expenses: Not on file  Food Insecurity:   . Worried About Programme researcher, broadcasting/film/video in the Last Year: Not on file  . Ran Out of Food in the Last Year: Not on file  Transportation Needs:   . Lack of Transportation (Medical): Not on file  . Lack of Transportation (Non-Medical): Not on file  Physical Activity:   . Days of Exercise per Week: Not on file  . Minutes of Exercise per Session: Not on file  Stress:   . Feeling of Stress : Not on file  Social Connections:   . Frequency of Communication with Friends and Family: Not on file  . Frequency of Social Gatherings with Friends and Family: Not on file  . Attends Religious Services: Not on file  . Active Member of Clubs or Organizations: Not on file  . Attends Banker Meetings: Not on file  . Marital Status: Not on file  Intimate Partner Violence:   . Fear of Current or Ex-Partner: Not on file  . Emotionally Abused: Not on file  . Physically Abused: Not on file  . Sexually Abused: Not on file     ROS- All systems are reviewed and negative except as per the HPI above.  Physical Exam: Vitals:   08/24/20 0904  BP: 110/78  Pulse: 68  Weight: (!) 147.7 kg  Height: 5\' 10"  (1.778 m)    GEN- The Russell [name] is well appearing obese male, alert and oriented x 3 today.   HEENT-head normocephalic, atraumatic, sclera clear, conjunctiva pink, hearing intact, trachea midline. Lungs- Clear to ausculation bilaterally, normal work of breathing Heart- Regular rate and rhythm, no murmurs, rubs or gallops  GI- soft, NT, ND, + BS Extremities- no clubbing, cyanosis, or edema MS- no significant deformity or atrophy Skin- no rash or lesion Psych-  euthymic mood, full affect Neuro- strength and sensation are intact   Wt Readings from Last 3 Encounters:  08/24/20 (!) 147.7 kg  08/17/20 (!) 146.8 kg  08/17/20 (!) 145.6 kg    EKG today demonstrates SR HR 68, PACs, PR 162, QRS 84, QTc 425  Epic records are reviewed at length today  CHA2DS2-VASc Score = 0  The Russell [name]'s score is based upon: CHF History: 0 HTN History: 0 Age : 0 Diabetes History: 0 Stroke History: 0 Vascular Disease History: 0 Gender: 0      ASSESSMENT AND PLAN: Paroxysmal Atrial Fibrillation/typical atrial flutter The Russell [name]'s CHA2DS2-VASc score is 0, indicating a 0.2% annual risk of stroke.   S/p atrial flutter ablation 04/17/20 with Dr 04/19/20. S/p DCCV for afib on 08/15/20. Russell [name] had one brief episode of afib.  Continue Multaq 400 mg BID Echocardiogram ordered pending scheduling. Continue Xarelto 20 mg daily for now, at least 4 weeks post DCCV. If he does not have any sustained episodes of afib requiring DCCV in the future, will stop given low CHADS2VASC score.   2. Obesity Body mass index is 46.72 kg/m. Lifestyle modification was discussed and encouraged including regular physical activity and weight reduction.  3. Obstructive sleep apnea Russell [name] has reached out to pulmonology at [facility] to get reestablished and resume CPAP.    Follow up in the AF clinic in 4 months.    Jorja Loa PA-C Afib Clinic [facility] [address] [place], [place] [address] [phone] 08/24/2020 9:38 AM

## 2020-09-18 MED FILL — XARELTO 20 MG TABLET: 20 | 30 days supply | Qty: 30 | Fill #2

## 2020-09-18 MED FILL — MULTAQ 400 MG TABLET: 400 | 30 days supply | Qty: 60 | Fill #1

## 2020-10-01 ENCOUNTER — Other Ambulatory Visit: Payer: Self-pay

## 2020-10-01 ENCOUNTER — Emergency Department (HOSPITAL_BASED_OUTPATIENT_CLINIC_OR_DEPARTMENT_OTHER)
Admission: EM | Admit: 2020-10-01 | Discharge: 2020-10-01 | Disposition: A | Discharge status: Home / Self Care | Payer: BC Managed Care – PPO | Attending: Emergency Medicine | Admitting: Emergency Medicine

## 2020-10-01 ENCOUNTER — Other Ambulatory Visit (HOSPITAL_BASED_OUTPATIENT_CLINIC_OR_DEPARTMENT_OTHER): Payer: Self-pay | Admitting: Physician Assistant

## 2020-10-01 ENCOUNTER — Encounter (HOSPITAL_BASED_OUTPATIENT_CLINIC_OR_DEPARTMENT_OTHER): Payer: Self-pay

## 2020-10-01 DIAGNOSIS — F1590 Other stimulant use, unspecified, uncomplicated: Secondary | ICD-10-CM | POA: Diagnosis not present

## 2020-10-01 DIAGNOSIS — Z7901 Long term (current) use of anticoagulants: Secondary | ICD-10-CM | POA: Insufficient documentation

## 2020-10-01 DIAGNOSIS — L02412 Cutaneous abscess of left axilla: Secondary | ICD-10-CM | POA: Insufficient documentation

## 2020-10-01 DIAGNOSIS — Z87891 Personal history of nicotine dependence: Secondary | ICD-10-CM | POA: Diagnosis not present

## 2020-10-01 DIAGNOSIS — Z23 Encounter for immunization: Secondary | ICD-10-CM | POA: Insufficient documentation

## 2020-10-01 MED ORDER — ACETAMINOPHEN 500 MG PO TABS
1000.0000 mg | ORAL_TABLET | Freq: Once | ORAL | Status: AC
  Administered 2020-10-01: 1000 mg via ORAL
  Filled 2020-10-01: qty 2

## 2020-10-01 MED ORDER — LIDOCAINE HCL (PF) 1 % IJ SOLN
10.0000 mL | Freq: Once | INTRAMUSCULAR | Status: AC
  Administered 2020-10-01: 10 mL
  Filled 2020-10-01: qty 10

## 2020-10-01 MED ORDER — TETANUS-DIPHTH-ACELL PERTUSSIS 5-2.5-18.5 LF-MCG/0.5 IM SUSP
0.5000 mL | Freq: Once | INTRAMUSCULAR | Status: AC
  Administered 2020-10-01: 0.5 mL via INTRAMUSCULAR
  Filled 2020-10-01: qty 0.5

## 2020-10-01 MED ORDER — CLINDAMYCIN HCL 300 MG PO CAPS: 300.0000 mg | ORAL_CAPSULE | Freq: Three times a day (TID) | ORAL | 0 refills | Status: DC

## 2020-10-01 NOTE — ED Notes (Signed)
I&D tray at bedside for EDP

## 2020-10-01 NOTE — ED Triage Notes (Signed)
Pt states he started a new deodorant a last Friday, swelling to left axilla started last saturday, tender to axilla and pain radiating to back and down left side.

## 2020-10-01 NOTE — Discharge Instructions (Signed)
Take the antibiotics as prescribed.  Warm compress to your arm.  Call the surgeon tomorrow to schedule an appointment for definitive management  Packing will need to be removed in 2 days.  Return for any worsening symptoms

## 2020-10-01 NOTE — ED Notes (Signed)
ED Provider at bedside. 

## 2020-10-01 NOTE — ED Provider Notes (Signed)
[facility] EMERGENCY DEPARTMENT Provider Note   CSN: [id number] Arrival date & time: 10/01/20  0865     History Chief Complaint  Patient presents with  . Abscess    Russell [name] is a 44 y.o. male with medical history significant for a flutter, A. fib on anticoagulation who presents for evaluation of abscess.  Patient states on Friday he noted a mass to his left axilla.  Started to become more tender.  He has not noticed any lying erythema or warmth.  Denies fever, chills, nausea, vomiting, congestion, rhinorrhea, chest pain, shortness of breath, paresthesias, weakness.  Has not take anything for symptoms.  Denies aggravating or alleviating factors. Rates his pain a 6/10.  History obtained from patient and past medical records.  No interpreter is used.  HPI     Past Medical History:  Diagnosis Date  . Atrial fibrillation (HCC)   . Atrial flutter [facility])     Patient Active Problem List   Diagnosis Date Noted  . Paroxysmal atrial fibrillation (HCC) 08/17/2020  . Secondary hypercoagulable state (HCC) 08/17/2020  . Typical atrial flutter (HCC) 03/09/2020    Past Surgical History:  Procedure Laterality Date  . A-FLUTTER ABLATION N/A 04/17/2020   Procedure: A-FLUTTER ABLATION;  Surgeon: Marinus Maw, MD;  Location: [facility] INVASIVE CV LAB;  Service: Cardiovascular;  Laterality: N/A;  . arm surgery Left        History reviewed. No pertinent family history.  Social History   Tobacco Use  . Smoking status: Former Smoker    Packs/day: 0.00  . Smokeless tobacco: Never Used  Vaping Use  . Vaping Use: Never used  Substance Use Topics  . Alcohol use: Not Currently    Comment: occ  . Drug use: Yes    Types: Marijuana    Comment: once every couple weeks    Home Medications Prior to Admission medications   Medication Sig Start Date End Date Taking? Authorizing Provider  acetaminophen (TYLENOL) 500 MG tablet Take 1,000 mg by mouth as needed for headache (pain).     Yes [provider]  dronedarone (MULTAQ) 400 MG tablet Take 1 tablet (400 mg total) by mouth 2 (two) times daily with a meal. 08/17/20  Yes Fenton, Clint R, PA  Multiple Vitamin (MULTIVITAMIN WITH MINERALS) TABS tablet Take 1 tablet by mouth every morning.   Yes [provider]  rivaroxaban (XARELTO) 20 MG TABS tablet Take 1 tablet (20 mg total) by mouth daily with supper. 08/15/20  Yes Melene Plan, DO  clindamycin (CLEOCIN) 300 MG capsule Take 1 capsule (300 mg total) by mouth 3 (three) times daily for 5 days. 10/01/20 10/06/20  Willys Salvino A, PA-C  apixaban (ELIQUIS) 5 MG TABS tablet Take 1 tablet (5 mg total) by mouth 2 (two) times daily for 30 days. Patient not taking: Reported on 10/03/2019 06/18/19 03/02/20  Gerhard Munch, MD    Allergies    Patient has no known allergies.  Review of Systems   Review of Systems  Constitutional: Negative.   HENT: Negative.   Respiratory: Negative.   Cardiovascular: Negative.   Gastrointestinal: Negative.   Genitourinary: Negative.   Musculoskeletal:       Left axilla pain  Skin: Negative.   Neurological: Negative.   All other systems reviewed and are negative.   Physical Exam Updated Vital Signs BP 131/71 (BP Location: Right Arm)   Pulse 87   Temp (!) 100.6 F (38.1 C) (Oral)   Resp 16   Ht  5\' 10"  (1.778 m)   Wt (!) 149.2 kg   SpO2 97%   BMI 47.21 kg/m   Physical Exam Vitals and nursing note reviewed.  Constitutional:      General: He is not in acute distress.    Appearance: He is well-developed. He is not ill-appearing, toxic-appearing or diaphoretic.  HENT:     Head: Normocephalic and atraumatic.     Mouth/Throat:     Mouth: Mucous membranes are moist.  Eyes:     Pupils: Pupils are equal, round, and reactive to light.  Cardiovascular:     Rate and Rhythm: Normal rate and regular rhythm.     Pulses: Normal pulses.     Heart sounds: Normal heart sounds.  Pulmonary:     Effort: Pulmonary effort is  normal. No respiratory distress.     Breath sounds: Normal breath sounds.  Abdominal:     General: Bowel sounds are normal. There is no distension.     Palpations: Abdomen is soft.     Tenderness: There is no abdominal tenderness. There is no right CVA tenderness, left CVA tenderness, guarding or rebound.  Musculoskeletal:        General: Normal range of motion.     Cervical back: Normal range of motion and neck supple.     Comments:  No bony tenderness.  Moves all 4 extremities at difficulty.  Erythema warmth extending to trunk, down arms.  Skin:    General: Skin is warm and dry.     Capillary Refill: Capillary refill takes less than 2 seconds.     Findings: Erythema present.     Comments: Very minimal overlying erythema approximately 6 cm.  4 cm induration however no obvious fluctuance.Difficult exam due to adipose tissue.  Does have mobile lesion to left axilla.  Ultrasound performed.  Shows cobblestoning as well as fluid collection.  Patient also has lipoma to this area. No tracking or streaking of skin  Neurological:     General: No focal deficit present.     Mental Status: He is alert and oriented to person, place, and time.     Comments: intact sensation Equal grip bilaterally     ED Results / Procedures / Treatments   Labs (all labs ordered are listed, but only abnormal results are displayed) Labs Reviewed - No data to display  EKG None  Radiology No results found.  Procedures . Incision and Drainage  Date/Time: 10/01/2020 11:37 AM Performed by: 12/01/2020, PA-C Authorized by: Linwood Dibbles, PA-C   Consent:    Consent obtained:  Verbal   Consent given by:  Patient   Risks discussed:  Bleeding, incomplete drainage, pain and damage to other organs   Alternatives discussed:  No treatment Universal protocol:    Procedure explained and questions answered to patient or proxy's satisfaction: yes     Relevant documents present and verified: yes     Test  results available and properly labeled: yes     Imaging studies available: yes     Required blood products, implants, devices, and special equipment available: yes     Site/side marked: yes     Immediately prior to procedure a time out was called: yes     Patient identity confirmed:  Verbally with patient Location:    Type:  Abscess   Size:  6 cm   Location:  Upper extremity   Upper extremity location:  Arm   Arm location:  L upper arm (Axilla) Pre-procedure details:  Skin preparation:  Betadine Anesthesia (see MAR for exact dosages):    Anesthesia method:  Local infiltration   Local anesthetic:  Lidocaine 1% w/o epi Procedure type:    Complexity:  Complex Procedure details:    Incision types:  Single straight   Incision depth:  Subcutaneous   Scalpel blade:  11   Wound management:  Probed and deloculated, irrigated with saline and extensive cleaning   Drainage:  Purulent   Drainage amount:  Moderate   Packing materials:  1/4 in gauze Post-procedure details:    Patient tolerance of procedure:  Tolerated well, no immediate complications   (including critical care time)  EMERGENCY DEPARTMENT US SOFT TISSUE INTERPRETATION "Study: Limited Soft Tissue Ultrasound"  INDICATIONS: Soft tissue infection Multiple views of the body part were obtained in real-time with a multi-frequency linear probe  PERFORMED BY: Myself IMAGES ARCHIVED?: Yes SIDE:Left BODY PART:Upper extremity and Axilla INTERPRETATION:  Abcess present and Cellulitis present       Medications Ordered in ED Medications  lidocaine (PF) (XYLOCAINE) 1 % injection 10 mL (10 mLs Infiltration Given by Other 10/01/20 1052)  Tdap (BOOSTRIX) injection 0.5 mL (0.5 mLs Intramuscular Given 10/01/20 1054)  acetaminophen (TYLENOL) tablet 1,000 mg (1,000 mg Oral Given 10/01/20 1108)    ED Course  I have reviewed the triage vital signs and the nursing notes.  Pertinent labs & imaging results that were available during my  care of the patient were reviewed by me and considered in my medical decision making (see chart for details).  44 year old presents for evaluation of mass to left axilla which began a few days PTA.  On arrival he was afebrile however developed a temperature of 100.6. Does not appear septic or ill.  No upper respiratory complaints.  Low suspicion for Covid.  Patient does have some very minimal cellulitis to his left axilla.  Difficult exam to assess for abscess due to patient's body habitus.  Ultrasound performed which does show cobblestoning and fluid collection.  Needle drainage was attempted with ultrasound guidance due to difficult exam.  Purulent drainage was removed.  Then proceeded with stab incision with mild drainage which is purulent in nature.  Drain was placed.  Encourage warm soaks.  Will start antibiotics as well as general surgery follow-up for definitive management given difficult exam.  Low suspicion for deep space infection.  The patient has been appropriately medically screened and/or stabilized in the ED. I have low suspicion for any other emergent medical condition which would require further screening, evaluation or treatment in the ED or require inpatient management.  Patient is hemodynamically stable and in no acute distress.  Patient able to ambulate in department prior to ED.  Evaluation does not show acute pathology that would require ongoing or additional emergent interventions while in the emergency department or further inpatient treatment.  I have discussed the diagnosis with the patient and answered all questions.  Pain is been managed while in the emergency department and patient has no further complaints prior to discharge.  Patient is comfortable with plan discussed in room and is stable for discharge at this time.  I have discussed strict return precautions for returning to the emergency department.  Patient was encouraged to follow-up with PCP/specialist refer to at  discharge.    MDM Rules/Calculators/A&P                           Final Clinical Impression(s) / ED Diagnoses Final diagnoses:  Abscess of left axilla    Rx / DC Orders ED Discharge Orders         Ordered    clindamycin (CLEOCIN) 300 MG capsule  3 times daily        10/01/20 1139           Kinsley Holderman A, PA-C 10/01/20 1140    Cathren LaineSteinl, Kevin, MD 10/04/20 [phone]

## 2020-10-02 ENCOUNTER — Other Ambulatory Visit (HOSPITAL_COMMUNITY): Payer: Self-pay | Admitting: General Surgery

## 2020-10-02 ENCOUNTER — Other Ambulatory Visit: Payer: Self-pay | Admitting: General Surgery

## 2020-10-02 DIAGNOSIS — L02412 Cutaneous abscess of left axilla: Secondary | ICD-10-CM | POA: Diagnosis not present

## 2020-10-02 MED FILL — CLINDAMYCIN HCL 300 MG CAP: 300 | 5 days supply | Qty: 15 | Fill #0

## 2020-10-03 ENCOUNTER — Ambulatory Visit
Admission: RE | Admit: 2020-10-03 | Discharge: 2020-10-03 | Disposition: A | Discharge status: Home / Self Care | Payer: BC Managed Care – PPO | Source: Ambulatory Visit | Attending: General Surgery | Admitting: General Surgery

## 2020-10-03 ENCOUNTER — Other Ambulatory Visit: Payer: Self-pay | Admitting: General Surgery

## 2020-10-03 DIAGNOSIS — L02419 Cutaneous abscess of limb, unspecified: Secondary | ICD-10-CM

## 2020-10-03 DIAGNOSIS — M7989 Other specified soft tissue disorders: Secondary | ICD-10-CM | POA: Diagnosis not present

## 2020-10-03 DIAGNOSIS — M25522 Pain in left elbow: Secondary | ICD-10-CM | POA: Diagnosis not present

## 2020-10-03 DIAGNOSIS — R509 Fever, unspecified: Secondary | ICD-10-CM | POA: Diagnosis not present

## 2020-10-03 DIAGNOSIS — M19012 Primary osteoarthritis, left shoulder: Secondary | ICD-10-CM | POA: Diagnosis not present

## 2020-10-03 MED ORDER — IOPAMIDOL (ISOVUE-300) INJECTION 61%
100.0000 mL | Freq: Once | INTRAVENOUS | Status: AC | PRN
  Administered 2020-10-03: 100 mL via INTRAVENOUS

## 2020-10-11 ENCOUNTER — Other Ambulatory Visit: Payer: Self-pay

## 2020-10-11 ENCOUNTER — Ambulatory Visit (HOSPITAL_COMMUNITY)
Admission: RE | Admit: 2020-10-11 | Discharge: 2020-10-11 | Disposition: A | Discharge status: Home / Self Care | Payer: BC Managed Care – PPO | Source: Ambulatory Visit | Attending: Physician Assistant | Admitting: Physician Assistant

## 2020-10-11 ENCOUNTER — Encounter (HOSPITAL_COMMUNITY): Payer: Self-pay | Admitting: Physician Assistant

## 2020-10-11 DIAGNOSIS — I4892 Unspecified atrial flutter: Secondary | ICD-10-CM | POA: Insufficient documentation

## 2020-10-11 DIAGNOSIS — I480 Paroxysmal atrial fibrillation: Secondary | ICD-10-CM | POA: Insufficient documentation

## 2020-10-11 LAB — ECHOCARDIOGRAM COMPLETE
Area-P 1/2: 5.88 cm2
Calc EF: 50.2 %
S' Lateral: 3.7 cm
Single Plane A2C EF: 34.8 %
Single Plane A4C EF: 63.3 %

## 2020-10-11 MED ORDER — PERFLUTREN LIPID MICROSPHERE
1.0000 mL | INTRAVENOUS | Status: AC | PRN
  Administered 2020-10-11: 2 mL via INTRAVENOUS
  Filled 2020-10-11: qty 10

## 2020-10-11 NOTE — Progress Notes (Signed)
  Echocardiogram 2D Echocardiogram with definity has been performed.  Russell [name] 10/11/2020, 3:45 PM

## 2020-10-12 ENCOUNTER — Encounter (HOSPITAL_COMMUNITY): Payer: Self-pay | Admitting: *Deleted

## 2020-10-16 ENCOUNTER — Other Ambulatory Visit: Payer: Self-pay

## 2020-10-16 ENCOUNTER — Other Ambulatory Visit (HOSPITAL_COMMUNITY): Payer: Self-pay | Admitting: General Surgery

## 2020-10-16 ENCOUNTER — Emergency Department (HOSPITAL_BASED_OUTPATIENT_CLINIC_OR_DEPARTMENT_OTHER)
Admission: EM | Admit: 2020-10-16 | Discharge: 2020-10-16 | Disposition: A | Discharge status: Home / Self Care | Payer: BC Managed Care – PPO | Attending: Emergency Medicine | Admitting: Emergency Medicine

## 2020-10-16 ENCOUNTER — Encounter (HOSPITAL_BASED_OUTPATIENT_CLINIC_OR_DEPARTMENT_OTHER): Payer: Self-pay | Admitting: *Deleted

## 2020-10-16 ENCOUNTER — Emergency Department (HOSPITAL_BASED_OUTPATIENT_CLINIC_OR_DEPARTMENT_OTHER): Payer: BC Managed Care – PPO

## 2020-10-16 DIAGNOSIS — L02419 Cutaneous abscess of limb, unspecified: Secondary | ICD-10-CM

## 2020-10-16 DIAGNOSIS — M47814 Spondylosis without myelopathy or radiculopathy, thoracic region: Secondary | ICD-10-CM | POA: Diagnosis not present

## 2020-10-16 DIAGNOSIS — R590 Localized enlarged lymph nodes: Secondary | ICD-10-CM | POA: Insufficient documentation

## 2020-10-16 DIAGNOSIS — Z7901 Long term (current) use of anticoagulants: Secondary | ICD-10-CM | POA: Diagnosis not present

## 2020-10-16 DIAGNOSIS — Z20822 Contact with and (suspected) exposure to covid-19: Secondary | ICD-10-CM | POA: Diagnosis not present

## 2020-10-16 DIAGNOSIS — R591 Generalized enlarged lymph nodes: Secondary | ICD-10-CM | POA: Diagnosis not present

## 2020-10-16 DIAGNOSIS — Z87891 Personal history of nicotine dependence: Secondary | ICD-10-CM | POA: Insufficient documentation

## 2020-10-16 DIAGNOSIS — R509 Fever, unspecified: Secondary | ICD-10-CM | POA: Diagnosis not present

## 2020-10-16 LAB — BASIC METABOLIC PANEL
Anion gap: 7 (ref 5–15)
BUN: 13 mg/dL (ref 6–20)
CO2: 24 mmol/L (ref 22–32)
Calcium: 8.5 mg/dL — ABNORMAL LOW (ref 8.9–10.3)
Chloride: 102 mmol/L (ref 98–111)
Creatinine, Ser: 0.85 mg/dL (ref 0.61–1.24)
GFR, Estimated: >60 mL/min (ref >60)
Glucose, Bld: 103 mg/dL — ABNORMAL HIGH (ref 70–99)
Potassium: 3.8 mmol/L (ref 3.5–5.1)
Sodium: 133 mmol/L — ABNORMAL LOW (ref 135–145)

## 2020-10-16 LAB — CBC WITH DIFFERENTIAL/PLATELET
Abs Immature Granulocytes: 0.01 10*3/uL (ref 0.00–0.07)
Basophils Absolute: 0 10*3/uL (ref 0.0–0.1)
Basophils Relative: 1 %
Eosinophils Absolute: 0.1 10*3/uL (ref 0.0–0.5)
Eosinophils Relative: 1 %
HCT: 33.9 % — ABNORMAL LOW (ref 39.0–52.0)
Hemoglobin: 11.4 g/dL — ABNORMAL LOW (ref 13.0–17.0)
Immature Granulocytes: 0 %
Lymphocytes Relative: 30 %
Lymphs Abs: 1.3 10*3/uL (ref 0.7–4.0)
MCH: 30.8 pg (ref 26.0–34.0)
MCHC: 33.6 g/dL (ref 30.0–36.0)
MCV: 91.6 fL (ref 80.0–100.0)
Monocytes Absolute: 0.4 10*3/uL (ref 0.1–1.0)
Monocytes Relative: 10 %
Neutro Abs: 2.5 10*3/uL (ref 1.7–7.7)
Neutrophils Relative %: 58 %
Platelets: 233 10*3/uL (ref 150–400)
RBC: 3.7 MIL/uL — ABNORMAL LOW (ref 4.22–5.81)
RDW: 13.9 % (ref 11.5–15.5)
WBC: 4.3 10*3/uL (ref 4.0–10.5)
nRBC: 0 % (ref 0.0–0.2)

## 2020-10-16 LAB — LACTIC ACID, PLASMA: Lactic Acid, Venous: 0.8 mmol/L (ref 0.5–1.9)

## 2020-10-16 LAB — RESPIRATORY PANEL BY RT PCR (FLU A&B, COVID)
Influenza A by PCR: NEGATIVE
Influenza B by PCR: NEGATIVE
SARS Coronavirus 2 by RT PCR: NEGATIVE

## 2020-10-16 MED ORDER — IOHEXOL 300 MG/ML  SOLN
100.0000 mL | Freq: Once | INTRAMUSCULAR | Status: AC | PRN
  Administered 2020-10-16: 100 mL via INTRAVENOUS

## 2020-10-16 NOTE — ED Notes (Signed)
Pt has had all symptoms one week ago.

## 2020-10-16 NOTE — Discharge Instructions (Addendum)
Please schedule follow-up with general surgery to schedule a biopsy.  When making the appointment, remind them that you are on Xarelto in case they would like to give you instructions on stopping it before an appointment.  If you have any worsening symptoms or concerns, please return to the ED.

## 2020-10-16 NOTE — ED Provider Notes (Signed)
[facility] EMERGENCY DEPARTMENT Provider Note   CSN: [id number] Arrival date & time: 10/16/20  0348     History Chief Complaint  Patient presents with  . fever    DOSS CYBULSKI is a 44 y.o. male.  Patient presents to the emergency department with persistent tender lump in the left axilla.  Patient was seen in the emergency department on October 3 and had an incision and drainage, but reports not much drained out.  He followed up with general surgery and was on antibiotics pending follow-up.  He reports that he has been experiencing fever and chills again.  He has had some diarrhea, no nausea or vomiting.        Past Medical History:  Diagnosis Date  . Atrial fibrillation (HCC)   . Atrial flutter [facility])     Patient Active Problem List   Diagnosis Date Noted  . Paroxysmal atrial fibrillation (HCC) 08/17/2020  . Secondary hypercoagulable state (HCC) 08/17/2020  . Typical atrial flutter (HCC) 03/09/2020    Past Surgical History:  Procedure Laterality Date  . A-FLUTTER ABLATION N/A 04/17/2020   Procedure: A-FLUTTER ABLATION;  Surgeon: Marinus Maw, MD;  Location: [facility] INVASIVE CV LAB;  Service: Cardiovascular;  Laterality: N/A;  . arm surgery Left        No family history on file.  Social History   Tobacco Use  . Smoking status: Former Smoker    Packs/day: 0.00  . Smokeless tobacco: Never Used  Vaping Use  . Vaping Use: Never used  Substance Use Topics  . Alcohol use: Not Currently    Comment: occ  . Drug use: Yes    Types: Marijuana    Comment: once every couple weeks    Home Medications Prior to Admission medications   Medication Sig Start Date End Date Taking? Authorizing Provider  acetaminophen (TYLENOL) 500 MG tablet Take 1,000 mg by mouth as needed for headache (pain).     [provider]  dronedarone (MULTAQ) 400 MG tablet Take 1 tablet (400 mg total) by mouth 2 (two) times daily with a meal. 08/17/20   Fenton, Clint R, PA    Multiple Vitamin (MULTIVITAMIN WITH MINERALS) TABS tablet Take 1 tablet by mouth every morning.    [provider]  rivaroxaban (XARELTO) 20 MG TABS tablet Take 1 tablet (20 mg total) by mouth daily with supper. 08/15/20   Melene Plan, DO  apixaban (ELIQUIS) 5 MG TABS tablet Take 1 tablet (5 mg total) by mouth 2 (two) times daily for 30 days. Patient not taking: Reported on 10/03/2019 06/18/19 03/02/20  Gerhard Munch, MD    Allergies    Patient has no known allergies.  Review of Systems   Review of Systems  Constitutional: Positive for fever.  Skin: Negative for rash.  Hematological: Positive for adenopathy.  All other systems reviewed and are negative.   Physical Exam Updated Vital Signs BP 117/81 (BP Location: Right Arm)   Pulse 87   Temp 98.4 F (36.9 C) (Oral)   Resp 18   Ht 5\' 10"  (1.778 m)   Wt (!) 144.2 kg   SpO2 99%   BMI 45.63 kg/m   Physical Exam Vitals and nursing note reviewed.  Constitutional:      General: He is not in acute distress.    Appearance: Normal appearance. He is well-developed.  HENT:     Head: Normocephalic and atraumatic.     Right Ear: Hearing normal.     Left Ear: Hearing  normal.     Nose: Nose normal.  Eyes:     Conjunctiva/sclera: Conjunctivae normal.     Pupils: Pupils are equal, round, and reactive to light.  Cardiovascular:     Rate and Rhythm: Regular rhythm.     Heart sounds: S1 normal and S2 normal. No murmur heard.  No friction rub. No gallop.   Pulmonary:     Effort: Pulmonary effort is normal. No respiratory distress.     Breath sounds: Normal breath sounds.  Chest:     Chest wall: No tenderness.  Abdominal:     General: Bowel sounds are normal.     Palpations: Abdomen is soft.     Tenderness: There is no abdominal tenderness. There is no guarding or rebound. Negative signs include Murphy's sign and McBurney's sign.     Hernia: No hernia is present.  Musculoskeletal:        General: Normal range of motion.      Cervical back: Normal range of motion and neck supple.  Lymphadenopathy:     Upper Body:     Left upper body: Axillary adenopathy present.     Comments: Large lymphadenopathy left axilla without overlying erythema, induration or rash  Skin:    General: Skin is warm and dry.     Findings: No rash.  Neurological:     Mental Status: He is alert and oriented to person, place, and time.     GCS: GCS eye subscore is 4. GCS verbal subscore is 5. GCS motor subscore is 6.     Cranial Nerves: No cranial nerve deficit.     Sensory: No sensory deficit.     Coordination: Coordination normal.  Psychiatric:        Speech: Speech normal.        Behavior: Behavior normal.        Thought Content: Thought content normal.     ED Results / Procedures / Treatments   Labs (all labs ordered are listed, but only abnormal results are displayed) Labs Reviewed  CBC WITH DIFFERENTIAL/PLATELET - Abnormal; Notable for the following components:      Result Value   RBC 3.70 (*)    Hemoglobin 11.4 (*)    HCT 33.9 (*)    All other components within normal limits  BASIC METABOLIC PANEL - Abnormal; Notable for the following components:   Sodium 133 (*)    Glucose, Bld 103 (*)    Calcium 8.5 (*)    All other components within normal limits  RESPIRATORY PANEL BY RT PCR (FLU A&B, COVID)  CULTURE, BLOOD (ROUTINE X 2)  CULTURE, BLOOD (ROUTINE X 2)  LACTIC ACID, PLASMA  URINALYSIS, ROUTINE W REFLEX MICROSCOPIC    EKG None  Radiology CT CHEST W CONTRAST  Result Date: 10/16/2020 CLINICAL DATA:  Lymphadenopathy on recent antibiotics in for surgical follow-up, but interval worsening. EXAM: CT CHEST WITH CONTRAST TECHNIQUE: Multidetector CT imaging of the chest was performed during intravenous contrast administration. CONTRAST:  OMNIPAQUE IOHEXOL 300 MG/ML  SOLN COMPARISON:  Humerus CT 10/03/2020 FINDINGS: Cardiovascular: Normal heart size. No pericardial effusion but there is sheet like calcification seen  along the pericardium at multiple levels, most confluent inferiorly. No distorted appearance of ventricular or septal shape. Mediastinum/Nodes: No mediastinal adenopathy or mass. Lungs/Pleura: There is no edema, consolidation, effusion, or pneumothorax. Upper Abdomen: Probable hepatic steatosis. Partial coverage of the spleen shows enlarged size, at least 13 cm craniocaudal. Musculoskeletal: Thoracic spondylosis. No acute or aggressive finding. Other: Enlarged left  axillary and deep pectoral lymph nodes. The largest and most superficial node measures up to 4.6 cm, increased from prior when it measured up to 4 cm. The deeper nodes have also increased in size. A small area of low-density cavitation is still seen at the level of the largest and most superficial node-nonspecific. Axillary fat reticulation is also increased, which could be inflammation or lymphatic congestion. No fluid collection. IMPRESSION: 1. Progressive left axillary and deep pectoral lymphadenopathy. Adenitis and lymphoma are primary differential considerations, consider biopsy if there is no clear underlying cause. 2. Splenomegaly. 3. Pericardial calcification. Electronically Signed   By: Marnee Spring M.D.   On: 10/16/2020 05:47    Procedures Procedures (including critical care time)  Medications Ordered in ED Medications  iohexol (OMNIPAQUE) 300 MG/ML solution 100 mL (100 mLs Intravenous Contrast Given 10/16/20 0528)    ED Course  I have reviewed the triage vital signs and the nursing notes.  Pertinent labs & imaging results that were available during my care of the patient were reviewed by me and considered in my medical decision making (see chart for details).    MDM Rules/Calculators/A&P                          Patient with significantly enlarged left axillary lymph nodes that are tender to palpation without overlying signs of infection.  CT was repeated and lymphadenopathy appears worse than previous outpatient CT.   Findings are consistent for lymphoma.  He does not appear ill at this time.  No concern for abscess or other infection.  Vital signs are unremarkable.  He is afebrile here in the ED.  He will be appropriate for outpatient management.  Will refer back to general surgery to arrange for biopsy of lymph node.  Final Clinical Impression(s) / ED Diagnoses Final diagnoses:  Axillary lymphadenopathy    Rx / DC Orders ED Discharge Orders    None       Gilda Crease, MD 10/16/20 [phone]

## 2020-10-16 NOTE — ED Triage Notes (Addendum)
Pt states he was seen recently for swelling noted to his left axillary area. Has been treated with an antibiotic and has f/u with a general surgeon. States on Sat he started fever again on Sunday, n/v started on Saturday. C/o cold chills, cough, diarrhea. Also c/o feeling sob. Has not been vaccinated.

## 2020-10-17 ENCOUNTER — Encounter (HOSPITAL_COMMUNITY): Payer: Self-pay

## 2020-10-17 ENCOUNTER — Other Ambulatory Visit (HOSPITAL_COMMUNITY): Payer: Self-pay | Admitting: General Surgery

## 2020-10-17 DIAGNOSIS — L02419 Cutaneous abscess of limb, unspecified: Secondary | ICD-10-CM

## 2020-10-17 NOTE — Progress Notes (Signed)
[name] "Kathlene November" Male, 44 y.o., [birth year] MRN:  [id number] Phone:  [phone] Judie Petit) PCP:  Patient, No Pcp Per Coverage:  [employer] Ppo Next Appt With Radiology (MC-US 2) 10/27/2020 at 1:00 PM  RE: Biopsy Received: Yesterday Message Details  Gilmer Mor, DO  Lillia Mountain E OK for US guided left axillary node biopsy.   [address]   Previous Messages  ----- Message -----  From: Cory Munch  Sent: 10/16/2020  4:47 PM EDT  To: Ir Procedure Requests  Subject: Biopsy                      Procedure Requested: Ct Guided bx   Reason for Procedure: left axilla lymph node r/o Lymphoma    Provider Requesting: Axel Filler  Provider Telephone: [phone]

## 2020-10-18 ENCOUNTER — Encounter (HOSPITAL_COMMUNITY): Payer: Self-pay

## 2020-10-18 NOTE — Progress Notes (Signed)
ML 1  [name] "Kathlene November" Male, 44 y.o., [birth year] MRN:  [id number] Phone:  [phone] Judie Petit) PCP:  Patient, No Pcp Per Coverage:  [employer] Comm Ppo Next Appt With Radiology (MC-US 2) 10/27/2020 at 1:00 PM  RE: Blood Thinner/Biopsy Received: Today Message Details  Fenton, [place] R, PA  Lashunda Greis E Ok to hold Xarelto one day prior.   Previous Messages  ----- Message -----  From: Cory Munch  Sent: 10/18/2020 10:29 AM EDT  To: Danice Goltz, PA  Subject: RE: Blood Thinner/Biopsy             Hello    Pt. Hollibaugh is scheduled for a biopsy  And permission is needed to hold his xarelto  One day prior to the biopsy.   Camie Hauss  ----- Message -----  From: Axel Filler, MD  Sent: 10/17/2020  1:50 PM EDT  To: Cory Munch  Subject: RE: Blood Thinner/Biopsy             That permission would have to come from his MD that contorls his xarelto which isn't me.   AR  ----- Message -----  From: Cory Munch  Sent: 10/17/2020 11:27 AM EDT  To: Axel Filler, MD  Subject: Blood Thinner/Biopsy               Hello Dr Derrell Lolling   Pt. Cass is on the blood thinner xarelto.  He will need to hold it one day prior to his biopsy  Permission is needed.   Sandie Swayze  ----- Message -----  From: Gilmer Mor, DO  Sent: 10/16/2020  5:07 PM EDT  To: Cory Munch  Subject: RE: Biopsy                    OK for US guided left axillary node biopsy.   [address]  ----- Message -----  From: Cory Munch  Sent: 10/16/2020  4:47 PM EDT  To: Ir Procedure Requests  Subject: Biopsy                      Procedure Requested: Ct Guided bx   Reason for Procedure: left axilla lymph node r/o Lymphoma    Provider Requesting: Axel Filler  Provider Telephone: [phone]    Other Info:

## 2020-10-19 ENCOUNTER — Other Ambulatory Visit: Payer: Self-pay

## 2020-10-19 ENCOUNTER — Other Ambulatory Visit (HOSPITAL_BASED_OUTPATIENT_CLINIC_OR_DEPARTMENT_OTHER): Payer: Self-pay | Admitting: Emergency Medicine

## 2020-10-19 ENCOUNTER — Emergency Department (HOSPITAL_BASED_OUTPATIENT_CLINIC_OR_DEPARTMENT_OTHER)
Admission: EM | Admit: 2020-10-19 | Discharge: 2020-10-20 | Disposition: A | Discharge status: Home / Self Care | Payer: BC Managed Care – PPO | Attending: Emergency Medicine | Admitting: Emergency Medicine

## 2020-10-19 ENCOUNTER — Encounter (HOSPITAL_BASED_OUTPATIENT_CLINIC_OR_DEPARTMENT_OTHER): Payer: Self-pay

## 2020-10-19 DIAGNOSIS — I480 Paroxysmal atrial fibrillation: Secondary | ICD-10-CM | POA: Diagnosis not present

## 2020-10-19 DIAGNOSIS — Z87891 Personal history of nicotine dependence: Secondary | ICD-10-CM | POA: Diagnosis not present

## 2020-10-19 DIAGNOSIS — Z7901 Long term (current) use of anticoagulants: Secondary | ICD-10-CM | POA: Insufficient documentation

## 2020-10-19 DIAGNOSIS — E860 Dehydration: Secondary | ICD-10-CM | POA: Diagnosis not present

## 2020-10-19 DIAGNOSIS — R002 Palpitations: Secondary | ICD-10-CM | POA: Diagnosis not present

## 2020-10-19 LAB — COMPREHENSIVE METABOLIC PANEL
ALT: 22 U/L (ref 0–44)
AST: 28 U/L (ref 15–41)
Albumin: 3.7 g/dL (ref 3.5–5.0)
Alkaline Phosphatase: 65 U/L (ref 38–126)
Anion gap: 10 (ref 5–15)
BUN: 15 mg/dL (ref 6–20)
CO2: 24 mmol/L (ref 22–32)
Calcium: 8.9 mg/dL (ref 8.9–10.3)
Chloride: 99 mmol/L (ref 98–111)
Creatinine, Ser: 0.93 mg/dL (ref 0.61–1.24)
GFR, Estimated: >60 mL/min (ref >60)
Glucose, Bld: 98 mg/dL (ref 70–99)
Potassium: 3.6 mmol/L (ref 3.5–5.1)
Sodium: 133 mmol/L — ABNORMAL LOW (ref 135–145)
Total Bilirubin: 0.9 mg/dL (ref 0.3–1.2)
Total Protein: 8.5 g/dL — ABNORMAL HIGH (ref 6.5–8.1)

## 2020-10-19 LAB — CBC WITH DIFFERENTIAL/PLATELET
Abs Immature Granulocytes: 0.02 10*3/uL (ref 0.00–0.07)
Basophils Absolute: 0.1 10*3/uL (ref 0.0–0.1)
Basophils Relative: 2 %
Eosinophils Absolute: 0.1 10*3/uL (ref 0.0–0.5)
Eosinophils Relative: 2 %
HCT: 36.7 % — ABNORMAL LOW (ref 39.0–52.0)
Hemoglobin: 12.3 g/dL — ABNORMAL LOW (ref 13.0–17.0)
Immature Granulocytes: 1 %
Lymphocytes Relative: 44 %
Lymphs Abs: 1.6 10*3/uL (ref 0.7–4.0)
MCH: 30.8 pg (ref 26.0–34.0)
MCHC: 33.5 g/dL (ref 30.0–36.0)
MCV: 91.8 fL (ref 80.0–100.0)
Monocytes Absolute: 0.5 10*3/uL (ref 0.1–1.0)
Monocytes Relative: 14 %
Neutro Abs: 1.3 10*3/uL — ABNORMAL LOW (ref 1.7–7.7)
Neutrophils Relative %: 37 %
Platelets: 228 10*3/uL (ref 150–400)
RBC: 4 MIL/uL — ABNORMAL LOW (ref 4.22–5.81)
RDW: 13.9 % (ref 11.5–15.5)
Smear Review: NORMAL
WBC Morphology: ABNORMAL
WBC: 3.6 10*3/uL — ABNORMAL LOW (ref 4.0–10.5)
nRBC: 0 % (ref 0.0–0.2)

## 2020-10-19 LAB — MAGNESIUM: Magnesium: 2 mg/dL (ref 1.7–2.4)

## 2020-10-19 MED ORDER — ACETAMINOPHEN 500 MG PO TABS
ORAL_TABLET | ORAL | Status: AC
  Administered 2020-10-19: 1000 mg via ORAL
  Filled 2020-10-19: qty 2

## 2020-10-19 MED ORDER — ONDANSETRON 8 MG PO TBDP: 8.0000 mg | ORAL_TABLET | Freq: Three times a day (TID) | ORAL | 0 refills | Status: DC | PRN

## 2020-10-19 MED ORDER — ACETAMINOPHEN 500 MG PO TABS: 1000.0000 mg | ORAL_TABLET | Freq: Once | ORAL | Status: AC

## 2020-10-19 MED ORDER — NAPROXEN 250 MG PO TABS
500.0000 mg | ORAL_TABLET | Freq: Once | ORAL | Status: DC
  Filled 2020-10-19: qty 2

## 2020-10-19 MED ORDER — LACTATED RINGERS IV BOLUS
1000.0000 mL | Freq: Once | INTRAVENOUS | Status: AC
  Administered 2020-10-19: 1000 mL via INTRAVENOUS

## 2020-10-19 MED ORDER — OXYCODONE-ACETAMINOPHEN 5-325 MG PO TABS: 1.0000 | ORAL_TABLET | Freq: Four times a day (QID) | ORAL | 0 refills | Status: DC | PRN

## 2020-10-19 MED ORDER — MORPHINE SULFATE (PF) 4 MG/ML IV SOLN
4.0000 mg | Freq: Once | INTRAVENOUS | Status: AC
  Administered 2020-10-19: 4 mg via INTRAVENOUS
  Filled 2020-10-19: qty 1

## 2020-10-19 MED ORDER — ONDANSETRON HCL 4 MG/2ML IJ SOLN
4.0000 mg | Freq: Once | INTRAMUSCULAR | Status: AC
  Administered 2020-10-19: 4 mg via INTRAVENOUS
  Filled 2020-10-19: qty 2

## 2020-10-19 NOTE — ED Notes (Signed)
ED Provider at bedside. 

## 2020-10-19 NOTE — ED Triage Notes (Signed)
Pt c/o "heart fluttering" x 1 week-NAD-steady gait

## 2020-10-19 NOTE — Discharge Instructions (Signed)
Keep taking your meds.  Use the nausea meds as needed.  Try to stay hydrated.  Labs look ok.  If you start having high fever, passing out, persistent elevated heart rate or unable to keep anything down return to the ER.

## 2020-10-19 NOTE — ED Notes (Signed)
Dr. Read Drivers informed of pt's elevated temp.

## 2020-10-19 NOTE — ED Provider Notes (Signed)
[facility] EMERGENCY DEPARTMENT Provider Note   CSN: [id number] Arrival date & time: 10/19/20  2129     History Chief Complaint  Patient presents with  . Palpitations    Russell [name] is a 44 y.o. male.  Patient is a 44 year old male with a history of paroxysmal atrial fibrillation on Xarelto and Multaq with recent discovery of adenopathy in the left axilla with concern for possible lymphoma that is currently getting worked up who presents today with complaint of palpitations.  Patient reports he has noticed the palpitations over the last 1 week but they were worse tonight.  Stating his heart rate would increase to 200 and then decrease down to 90 and then increase again to 150.  Patient attributes this to poor oral intake and poor sleep over the last 1 to 2 weeks.  Patient reports that about a month ago he started noticing the lump in his left axilla.  It initially was I&D did without improvement and he was started on an oral antibiotic.  He did see the general surgeon who wants to do a biopsy of the area but they wanted him to finish antibiotics first.  He reports while on antibiotics he started feeling better but he finished the antibiotics approximately 1 week ago and he has had worsening pain in his left axilla, ongoing nausea, intermittent vomiting and diarrhea.  He has had low-grade temperatures to 99 and when seen in the ER 3 days ago was 100.3.  He had a CT done with contrast that showed concern for lymphoma as he had multiple enlarged lymph nodes.  At that time CBC was normal, CMP showed mild hypocalcemia but no other acute findings.  Patient reports he has been compliant with his medications.  He has had a mild cough but was Covid, flu negative on Monday.  He has had ongoing shortness of breath which is worse with exertion.  His biggest complaint is severe pain in his left axilla that makes it very difficult for him to even put his arm down.  He denies any abdominal pain or  leg swelling.  The history is provided by the patient.  Palpitations      Past Medical History:  Diagnosis Date  . Atrial fibrillation (HCC)   . Atrial flutter [facility])     Patient Active Problem List   Diagnosis Date Noted  . Paroxysmal atrial fibrillation (HCC) 08/17/2020  . Secondary hypercoagulable state (HCC) 08/17/2020  . Typical atrial flutter (HCC) 03/09/2020    Past Surgical History:  Procedure Laterality Date  . A-FLUTTER ABLATION N/A 04/17/2020   Procedure: A-FLUTTER ABLATION;  Surgeon: Marinus Maw, MD;  Location: [facility] INVASIVE CV LAB;  Service: Cardiovascular;  Laterality: N/A;  . arm surgery Left        No family history on file.  Social History   Tobacco Use  . Smoking status: Former Smoker    Packs/day: 0.00  . Smokeless tobacco: Never Used  Vaping Use  . Vaping Use: Never used  Substance Use Topics  . Alcohol use: Yes    Comment: occ  . Drug use: Yes    Types: Marijuana    Home Medications Prior to Admission medications   Medication Sig Start Date End Date Taking? Authorizing Provider  acetaminophen (TYLENOL) 500 MG tablet Take 1,000 mg by mouth as needed for headache (pain).     [provider]  dronedarone (MULTAQ) 400 MG tablet Take 1 tablet (400 mg total) by mouth 2 (two)  times daily with a meal. 08/17/20   Fenton, Clint R, PA  Multiple Vitamin (MULTIVITAMIN WITH MINERALS) TABS tablet Take 1 tablet by mouth every morning.    [provider]  rivaroxaban (XARELTO) 20 MG TABS tablet Take 1 tablet (20 mg total) by mouth daily with supper. 08/15/20   Melene Plan, DO  apixaban (ELIQUIS) 5 MG TABS tablet Take 1 tablet (5 mg total) by mouth 2 (two) times daily for 30 days. Patient not taking: Reported on 10/03/2019 06/18/19 03/02/20  Gerhard Munch, MD    Allergies    Patient has no known allergies.  Review of Systems   Review of Systems  Cardiovascular: Positive for palpitations.  All other systems reviewed and are  negative.   Physical Exam Updated Vital Signs BP 110/74 (BP Location: Right Arm)   Pulse 92   Temp 99.4 F (37.4 C) (Oral)   Resp 20   SpO2 96%   Physical Exam Vitals and nursing note reviewed.  Constitutional:      General: He is not in acute distress.    Appearance: He is well-developed. He is obese.  HENT:     Head: Normocephalic and atraumatic.     Mouth/Throat:     Mouth: Mucous membranes are moist.  Eyes:     Conjunctiva/sclera: Conjunctivae normal.     Pupils: Pupils are equal, round, and reactive to light.  Cardiovascular:     Rate and Rhythm: Normal rate. Rhythm irregular.     Pulses: Normal pulses.     Heart sounds: No murmur heard.   Pulmonary:     Effort: Pulmonary effort is normal. No respiratory distress.     Breath sounds: Normal breath sounds. No wheezing or rales.  Abdominal:     General: There is no distension.     Palpations: Abdomen is soft.     Tenderness: There is no abdominal tenderness. There is no guarding or rebound.  Musculoskeletal:        General: Tenderness present. Normal range of motion.       Arms:     Cervical back: Normal range of motion and neck supple.     Right lower leg: No edema.     Left lower leg: No edema.  Skin:    General: Skin is warm and dry.     Findings: No erythema or rash.  Neurological:     General: No focal deficit present.     Mental Status: He is alert and oriented to person, place, and time. Mental status is at baseline.  Psychiatric:        Mood and Affect: Mood normal.        Behavior: Behavior normal.        Thought Content: Thought content normal.     ED Results / Procedures / Treatments   Labs (all labs ordered are listed, but only abnormal results are displayed) Labs Reviewed  CBC WITH DIFFERENTIAL/PLATELET - Abnormal; Notable for the following components:      Result Value   WBC 3.6 (*)    RBC 4.00 (*)    Hemoglobin 12.3 (*)    HCT 36.7 (*)    Neutro Abs 1.3 (*)    All other components  within normal limits  COMPREHENSIVE METABOLIC PANEL - Abnormal; Notable for the following components:   Sodium 133 (*)    Total Protein 8.5 (*)    All other components within normal limits  MAGNESIUM    EKG EKG Interpretation  Date/Time:  Thursday  October 19 2020 21:38:14 EDT Ventricular Rate:  90 PR Interval:    QRS Duration: 81 QT Interval:  439 QTC Calculation: 538 R Axis:   74 Text Interpretation: Sinus rhythm Low voltage, precordial leads Borderline T abnormalities, diffuse leads new Prolonged QT interval new st depression  in anterior leads compared to prior EKG Confirmed by Gwyneth SproutPlunkett, Brittin Janik ([id number]) on 10/19/2020 9:49:33 PM   Radiology No results found.  Procedures Procedures (including critical care time)  Medications Ordered in ED Medications  morphine 4 MG/ML injection 4 mg (has no administration in time range)  ondansetron (ZOFRAN) injection 4 mg (has no administration in time range)  lactated ringers bolus 1,000 mL (has no administration in time range)    ED Course  I have reviewed the triage vital signs and the nursing notes.  Pertinent labs & imaging results that were available during my care of the patient were reviewed by me and considered in my medical decision making (see chart for details).    MDM Rules/Calculators/A&P                          Patient with history of atrial fibrillation presenting today with palpitations.  Patient is going in and out of atrial fibrillation in the room while looking at the monitor.  Rates are controlled at most times to less than 90.  Patient is having significant pain in his left axilla and recently had a CT that showed concern for possible malignancy.  Patient does plan on getting biopsy of this area next Friday.  However for the last 1 week he has not been eating or drinking well due to ongoing nausea and intermittent diarrhea.  He has had low-grade fever and an occasional cough.  He reports the shortness of breath is  ongoing but suspect most likely related to dehydration as patient is already anticoagulated and low suspicion that this is a PE.  Patient had a CT of his chest from 3 days ago and there was no evidence of pneumonia as well as being Covid -3 days ago.  He satting 96% on room air and is in no evidence of respiratory distress.  There is no evidence of cellulitis at this time but does have a very tender firm nonmobile mass in his left axilla.  Will give IV fluids and pain and nausea control.  Will recheck electrolytes to ensure no acute changes from 3 days ago.  Patient has been compliant with his antiarrhythmic medication and his anticoagulation.   11:12 PM Labs without significant change since Monday with WBC of 3.6 and stable plts and hbg. CMP without acute findings.  Mag wnl.  Pt receiving fluid bolus but HR remains controlled going in and out of afib.  Pain improved.  Will plan on d/c after fluids but f/u with surgery as planned and with cardiology.  MDM Number of Diagnoses or Management Options   Amount and/or Complexity of Data Reviewed Clinical lab tests: ordered and reviewed Tests in the medicine section of CPT: ordered and reviewed Decide to obtain previous medical records or to obtain history from someone other than the patient: yes Obtain history from someone other than the patient: no Review and summarize past medical records: yes Discuss the patient with other providers: no Independent visualization of images, tracings, or specimens: yes  Risk of Complications, Morbidity, and/or Mortality Presenting problems: moderate Diagnostic procedures: low Management options: low  Patient Progress Patient progress: stable   Final Clinical Impression(s) /  ED Diagnoses Final diagnoses:  None    Rx / DC Orders ED Discharge Orders    None       Gwyneth Sprout, MD 10/19/20 2318

## 2020-10-20 MED FILL — MULTAQ 400 MG TABLET: 400 | 30 days supply | Qty: 60 | Fill #2

## 2020-10-20 MED FILL — ONDANSETRON ODT 8 MG TABLET: 8 | 6 days supply | Qty: 20 | Fill #0

## 2020-10-20 MED FILL — OXYCODONE-ACETAMINOPHEN 5-3: 5-325 | 5 days supply | Qty: 20 | Fill #0

## 2020-10-21 LAB — CULTURE, BLOOD (ROUTINE X 2)
Culture: NO GROWTH
Culture: NO GROWTH
Special Requests: ADEQUATE
Special Requests: ADEQUATE

## 2020-10-27 ENCOUNTER — Other Ambulatory Visit: Payer: Self-pay

## 2020-10-27 ENCOUNTER — Ambulatory Visit (HOSPITAL_COMMUNITY)
Admission: RE | Admit: 2020-10-27 | Discharge: 2020-10-27 | Disposition: A | Discharge status: Home / Self Care | Payer: BC Managed Care – PPO | Source: Ambulatory Visit | Attending: General Surgery | Admitting: General Surgery

## 2020-10-27 ENCOUNTER — Other Ambulatory Visit: Payer: Self-pay | Admitting: Student

## 2020-10-27 DIAGNOSIS — R590 Localized enlarged lymph nodes: Secondary | ICD-10-CM | POA: Diagnosis not present

## 2020-10-27 DIAGNOSIS — L02419 Cutaneous abscess of limb, unspecified: Secondary | ICD-10-CM

## 2020-10-27 DIAGNOSIS — I889 Nonspecific lymphadenitis, unspecified: Secondary | ICD-10-CM | POA: Diagnosis not present

## 2020-10-27 DIAGNOSIS — Z0000 Encounter for general adult medical examination without abnormal findings: Secondary | ICD-10-CM | POA: Diagnosis not present

## 2020-10-27 MED ORDER — FENTANYL CITRATE (PF) 100 MCG/2ML IJ SOLN
INTRAMUSCULAR | Status: AC
  Filled 2020-10-27: qty 2

## 2020-10-27 MED ORDER — MIDAZOLAM HCL 2 MG/2ML IJ SOLN
INTRAMUSCULAR | Status: AC | PRN
  Administered 2020-10-27: 1 mg via INTRAVENOUS
  Administered 2020-10-27: 0.5 mg via INTRAVENOUS

## 2020-10-27 MED ORDER — SODIUM CHLORIDE 0.9 % IV SOLN: INTRAVENOUS | Status: DC

## 2020-10-27 MED ORDER — MIDAZOLAM HCL 2 MG/2ML IJ SOLN
INTRAMUSCULAR | Status: AC
  Filled 2020-10-27: qty 2

## 2020-10-27 MED ORDER — LIDOCAINE HCL (PF) 1 % IJ SOLN
INTRAMUSCULAR | Status: AC
  Filled 2020-10-27: qty 30

## 2020-10-27 MED ORDER — SODIUM CHLORIDE 0.9 % IV SOLN
INTRAVENOUS | Status: AC | PRN
  Administered 2020-10-27: 10 mL/h via INTRAVENOUS

## 2020-10-27 MED ORDER — FENTANYL CITRATE (PF) 100 MCG/2ML IJ SOLN
INTRAMUSCULAR | Status: AC | PRN
Start: 2020-10-27 — End: 2020-10-27
  Administered 2020-10-27: 25 ug via INTRAVENOUS
  Administered 2020-10-27: 50 ug via INTRAVENOUS

## 2020-10-27 NOTE — Sedation Documentation (Signed)
02 d/c 

## 2020-10-27 NOTE — H&P (Signed)
Chief Complaint: Patient was seen in consultation today for left axillary lymphadenopathy/biopsy.  Referring Physician(s): Ramirez,Armando  Supervising Physician: Gilmer Mor  Patient Status: [facility] - Out-pt  History of Present Illness: Russell [name] is a 44 y.o. male with a past medical history of atrial fibrillation/flutter on chronic anticoagulation with Xarelto. In early 09/2020, patient noticed swelling of his left axilla that gradually became tender. He went to [facility] ED 10/01/2020 for further evaluation. He was found to have an abscess in this location, and underwent I&D of abscess in ED that day. He was then referred to general surgery on an outpatient basis for further management. CT left humerus was obtained by general surgery for further evaluation, which revealed left axillary lymphadenopathy.  CT humerus left 10/03/2020: 1. Multiple enlarged lymph nodes within the left axilla and subpectoral space, the largest with central low-density and surrounding inflammatory changes suspicious for suppurative lymphadenitis. Correlate with recent procedural results. This is more than typically seen with reactive adenopathy (even if the patient has had recent vaccination in the left shoulder). Differential diagnosis includes metastatic disease, and clinical correlation and chest CT follow-up recommended. 2. No acute osseous findings or evidence of osteomyelitis. 3. Probable pericardial calcifications.  IR consulted by Dr. Derrell Lolling for possible image-guided left axillary lymph node biopsy. Patient awake and alert sitting in chair with no complaints at this time. Denies fever, chills, chest pain, dyspnea, abdominal pain, or headache.  LD Xarelto 10/25/2020.   Past Medical History:  Diagnosis Date  . Atrial fibrillation (HCC)   . Atrial flutter [facility])     Past Surgical History:  Procedure Laterality Date  . A-FLUTTER ABLATION N/A 04/17/2020   Procedure: A-FLUTTER ABLATION;   Surgeon: Marinus Maw, MD;  Location: [facility] INVASIVE CV LAB;  Service: Cardiovascular;  Laterality: N/A;  . arm surgery Left     Allergies: Patient has no known allergies.  Medications: Prior to Admission medications   Medication Sig Start Date End Date Taking? Authorizing Provider  dronedarone (MULTAQ) 400 MG tablet Take 1 tablet (400 mg total) by mouth 2 (two) times daily with a meal. 08/17/20  Yes Fenton, Clint R, PA  Multiple Vitamin (MULTIVITAMIN WITH MINERALS) TABS tablet Take 1 tablet by mouth every morning.   Yes [provider]  ondansetron (ZOFRAN ODT) 8 MG disintegrating tablet Take 1 tablet (8 mg total) by mouth every 8 (eight) hours as needed for nausea or vomiting. 10/19/20  Yes Molpus, John, MD  oxyCODONE-acetaminophen (PERCOCET) 5-325 MG tablet Take 1 tablet by mouth every 6 (six) hours as needed (for pain). 10/19/20  Yes Molpus, John, MD  rivaroxaban (XARELTO) 20 MG TABS tablet Take 1 tablet (20 mg total) by mouth daily with supper. 08/15/20   Melene Plan, DO  apixaban (ELIQUIS) 5 MG TABS tablet Take 1 tablet (5 mg total) by mouth 2 (two) times daily for 30 days. Patient not taking: Reported on 10/03/2019 06/18/19 03/02/20  Gerhard Munch, MD     No family history on file.  Social History   Socioeconomic History  . Marital status: Single    Spouse name: Not on file  . Number of children: Not on file  . Years of education: Not on file  . Highest education level: Not on file  Occupational History  . Not on file  Tobacco Use  . Smoking status: Former Smoker    Packs/day: 0.00  . Smokeless tobacco: Never Used  Vaping Use  . Vaping Use: Never used  Substance and Sexual  Activity  . Alcohol use: Yes    Comment: occ  . Drug use: Yes    Types: Marijuana  . Sexual activity: Not on file  Other Topics Concern  . Not on file  Social History Narrative  . Not on file   Social Determinants of Health   Financial Resource Strain:   . Difficulty of Paying Living  Expenses: Not on file  Food Insecurity:   . Worried About Programme researcher, broadcasting/film/video in the Last Year: Not on file  . Ran Out of Food in the Last Year: Not on file  Transportation Needs:   . Lack of Transportation (Medical): Not on file  . Lack of Transportation (Non-Medical): Not on file  Physical Activity:   . Days of Exercise per Week: Not on file  . Minutes of Exercise per Session: Not on file  Stress:   . Feeling of Stress : Not on file  Social Connections:   . Frequency of Communication with Friends and Family: Not on file  . Frequency of Social Gatherings with Friends and Family: Not on file  . Attends Religious Services: Not on file  . Active Member of Clubs or Organizations: Not on file  . Attends Banker Meetings: Not on file  . Marital Status: Not on file     Review of Systems: A 12 point ROS discussed and pertinent positives are indicated in the HPI above.  All other systems are negative.  Review of Systems  Constitutional: Negative for chills and fever.  Respiratory: Negative for shortness of breath and wheezing.   Cardiovascular: Negative for chest pain and palpitations.  Gastrointestinal: Negative for abdominal pain.  Neurological: Negative for headaches.  Psychiatric/Behavioral: Negative for behavioral problems and confusion.    Vital Signs: BP 127/73   Pulse 70   Temp 97.9 F (36.6 C) (Oral)   Resp 17   Ht  (1.778 m)   Wt (!) 315 lb (142.9 kg)   SpO2 99%   BMI 45.20 kg/m   Physical Exam Vitals and nursing note reviewed.  Constitutional:      General: He is not in acute distress.    Appearance: Normal appearance.  Cardiovascular:     Rate and Rhythm: Normal rate and regular rhythm.     Heart sounds: Normal heart sounds. No murmur heard.   Pulmonary:     Effort: Pulmonary effort is normal. No respiratory distress.     Breath sounds: Normal breath sounds. No wheezing.  Skin:    General: Skin is warm and dry.  Neurological:      Mental Status: He is alert and oriented to person, place, and time.      MD Evaluation Airway: WNL Heart: WNL Abdomen: WNL Chest/ Lungs: WNL ASA  Classification: 2 Mallampati/Airway Score: One   Imaging: CT CHEST W CONTRAST  Result Date: 10/16/2020 CLINICAL DATA:  Lymphadenopathy on recent antibiotics in for surgical follow-up, but interval worsening. EXAM: CT CHEST WITH CONTRAST TECHNIQUE: Multidetector CT imaging of the chest was performed during intravenous contrast administration. CONTRAST:  OMNIPAQUE IOHEXOL 300 MG/ML  SOLN COMPARISON:  Humerus CT 10/03/2020 FINDINGS: Cardiovascular: Normal heart size. No pericardial effusion but there is sheet like calcification seen along the pericardium at multiple levels, most confluent inferiorly. No distorted appearance of ventricular or septal shape. Mediastinum/Nodes: No mediastinal adenopathy or mass. Lungs/Pleura: There is no edema, consolidation, effusion, or pneumothorax. Upper Abdomen: Probable hepatic steatosis. Partial coverage of the spleen shows enlarged size, at least 13  cm craniocaudal. Musculoskeletal: Thoracic spondylosis. No acute or aggressive finding. Other: Enlarged left axillary and deep pectoral lymph nodes. The largest and most superficial node measures up to 4.6 cm, increased from prior when it measured up to 4 cm. The deeper nodes have also increased in size. A small area of low-density cavitation is still seen at the level of the largest and most superficial node-nonspecific. Axillary fat reticulation is also increased, which could be inflammation or lymphatic congestion. No fluid collection. IMPRESSION: 1. Progressive left axillary and deep pectoral lymphadenopathy. Adenitis and lymphoma are primary differential considerations, consider biopsy if there is no clear underlying cause. 2. Splenomegaly. 3. Pericardial calcification. Electronically Signed   By: Marnee Spring M.D.   On: 10/16/2020 05:47   CT HUMERUS LEFT W  CONTRAST  Result Date: 10/03/2020 CLINICAL DATA:  Left axillary pain and swelling with fever for 1 week. No acute injury or history of malignancy. Left axillary irrigation and drainage in the emergency room on 10/01/2020. EXAM: CT OF THE UPPER LEFT EXTREMITY WITH CONTRAST TECHNIQUE: Multidetector CT imaging of the left upper arm was performed according to the standard protocol following intravenous contrast administration. CONTRAST:  ISOVUE-300 IOPAMIDOL (ISOVUE-300) INJECTION 61% COMPARISON:  Left axillary ultrasound images 10/01/2020. FINDINGS: Bones/Joint/Cartilage Imaging was performed with the patient's left arm extended above his head. Images extend from the elbow through the mid left chest. There is no evidence of acute fracture, dislocation or bone destruction. Moderate degenerative changes are present at the elbow. No significant glenohumeral arthropathy or effusion. Ligaments Suboptimally assessed by CT. Muscles and Tendons Unremarkable.  No focal muscular atrophy or abnormal enhancement. Soft tissues There are multiple enlarged lymph nodes within the left axilla. The largest node measures 3.9 x 3.7 cm on image 164/4 and has a low-density component measuring up to 2.1 cm on coronal image 65/8. There is mild soft tissue stranding surrounding this dominant lymph nodes. There are other enlarged lymph nodes without central necrosis, extending into the subpectoral space. The next largest nodes measure 3.3 x 2.4 cm on image 165/4 and 2.3 x 1.6 cm on image 173/4. No visible left supraclavicular, mediastinal or left hilar adenopathy. Possible mild dermal thickening in the left axilla. Probable mild gynecomastia accounting for a subcutaneous nodule in the anterior left chest wall measuring 3.3 cm on image 201/4. Probable pericardial calcifications. IMPRESSION: 1. Multiple enlarged lymph nodes within the left axilla and subpectoral space, the largest with central low-density and surrounding inflammatory  changes suspicious for suppurative lymphadenitis. Correlate with recent procedural results. This is more than typically seen with reactive adenopathy (even if the patient has had recent vaccination in the left shoulder). Differential diagnosis includes metastatic disease, and clinical correlation and chest CT follow-up recommended. 2. No acute osseous findings or evidence of osteomyelitis. 3. Probable pericardial calcifications. Electronically Signed   By: Carey Bullocks M.D.   On: 10/03/2020 14:58   ECHOCARDIOGRAM COMPLETE  Result Date: 10/11/2020    ECHOCARDIOGRAM REPORT   Patient Name:   SAHAN PEN Date of Exam: 10/11/2020 Medical Rec #:  [id number]       Height:       70.0 in Accession #:    [id number]      Weight:       329.0 lb Date of Birth:  Jun 30, 1976      BSA:          2.579 m Patient Age:    43 years        BP:  115/61 mmHg Patient Gender: M               HR:           77 bpm. Exam Location:  Outpatient Procedure: 2D Echo and Intracardiac Opacification Agent Indications:    Atrial Fibrillation 427.31 / I48.91  History:        Patient has prior history of Echocardiogram examinations.                 Arrythmias:Atrial Fibrillation and Atrial Flutter; Risk                 Factors:Sleep Apnea. Ablation 04/17/2020.  Sonographer:    Leta Jungling RDCS Referring Phys: [id number] CLINT R FENTON IMPRESSIONS  1. Left ventricular ejection fraction, by estimation, is 55 to 60%. The left ventricle has normal function. The left ventricle has no regional wall motion abnormalities. Left ventricular diastolic parameters were normal.  2. Right ventricular systolic function is normal. The right ventricular size is normal. Tricuspid regurgitation signal is inadequate for assessing PA pressure.  3. The mitral valve is normal in structure. No evidence of mitral valve regurgitation.  4. The aortic valve was not well visualized. Aortic valve regurgitation is not visualized. No aortic stenosis is present.  5. The  inferior vena cava is normal in size with <50% respiratory variability, suggesting right atrial pressure of 8 mmHg. FINDINGS  Left Ventricle: Left ventricular ejection fraction, by estimation, is 55 to 60%. The left ventricle has normal function. The left ventricle has no regional wall motion abnormalities. Definity contrast agent was given IV to delineate the left ventricular  endocardial borders. The left ventricular internal cavity size was normal in size. There is no left ventricular hypertrophy. Left ventricular diastolic parameters were normal. Right Ventricle: The right ventricular size is normal. Right vetricular wall thickness was not assessed. Right ventricular systolic function is normal. Tricuspid regurgitation signal is inadequate for assessing PA pressure. Left Atrium: Left atrial size was not well visualized. Right Atrium: Right atrial size was not well visualized. Pericardium: There is no evidence of pericardial effusion. Mitral Valve: The mitral valve is normal in structure. No evidence of mitral valve regurgitation. Tricuspid Valve: The tricuspid valve is normal in structure. Tricuspid valve regurgitation is trivial. Aortic Valve: The aortic valve was not well visualized. Aortic valve regurgitation is not visualized. No aortic stenosis is present. Pulmonic Valve: The pulmonic valve was not well visualized. Pulmonic valve regurgitation is not visualized. Aorta: The aortic root is normal in size and structure. Venous: The inferior vena cava is normal in size with less than 50% respiratory variability, suggesting right atrial pressure of 8 mmHg. IAS/Shunts: The interatrial septum was not well visualized.  LEFT VENTRICLE PLAX 2D LVIDd:         4.20 cm      Diastology LVIDs:         3.70 cm      LV e' medial:    15.20 cm/s LV PW:         0.80 cm      LV E/e' medial:  6.6 LV IVS:        0.80 cm      LV e' lateral:   12.50 cm/s LVOT diam:     1.90 cm      LV E/e' lateral: 8.0 LV SV:         42 LV SV  Index:   16 LVOT Area:     2.84 cm  LV Volumes (  MOD) LV vol d, MOD A2C: 130.0 ml LV vol d, MOD A4C: 124.0 ml LV vol s, MOD A2C: 84.8 ml LV vol s, MOD A4C: 45.5 ml LV SV MOD A2C:     45.2 ml LV SV MOD A4C:     124.0 ml LV SV MOD BP:      65.8 ml RIGHT VENTRICLE RV S prime:     13.30 cm/s TAPSE (M-mode): 2.6 cm LEFT ATRIUM           Index LA diam:      4.20 cm 1.63 cm/m LA Vol (A4C): 48.6 ml 18.84 ml/m  AORTIC VALVE LVOT Vmax:   91.40 cm/s LVOT Vmean:  62.600 cm/s LVOT VTI:    0.149 m  AORTA Ao Root diam: 2.70 cm MITRAL VALVE MV Area (PHT): 5.88 cm     SHUNTS MV Decel Time: 129 msec     Systemic VTI:  0.15 m MV E velocity: 100.00 cm/s  Systemic Diam: 1.90 cm MV A velocity: 50.60 cm/s MV E/A ratio:  1.98 Epifanio Lescheshristopher Schumann MD Electronically signed by Epifanio Lescheshristopher Schumann MD Signature Date/Time: 10/11/2020/4:57:43 PM    Final     Labs:  CBC: Recent Labs    06/21/20 0656 08/15/20 0900 10/16/20 0449 10/19/20 2236  WBC 3.5* 6.4 4.3 3.6*  HGB 13.8 13.9 11.4* 12.3*  HCT 40.7 40.5 33.9* 36.7*  PLT 145* 167 233 228    COAGS: No results for input(s): INR, APTT in the last 8760 hours.  BMP: Recent Labs    03/02/20 0652 03/02/20 0652 04/14/20 1404 06/21/20 0656 08/15/20 0900 10/16/20 0449 10/19/20 2236  NA 137   < > 139 137 138 133* 133*  K 4.0   < > 4.1 3.6 4.1 3.8 3.6  CL 106   < > 103 105 105 102 99  CO2 21*   < > 24 23 25 24 24   GLUCOSE 106*   < > 69 105* 97 103* 98  BUN 15   < > 19 13 16 13 15   CALCIUM 8.8*   < > 8.8 8.9 9.3 8.5* 8.9  CREATININE 0.72   < > 0.81 0.85 0.67 0.85 0.93  GFRNONAA >60   < > 109 >60 >60 >60 >60  GFRAA >60  --  126 >60 >60  --   --    < > = values in this interval not displayed.    LIVER FUNCTION TESTS: Recent Labs    06/21/20 0656 10/19/20 2236  BILITOT 0.8 0.9  AST 22 28  ALT 25 22  ALKPHOS 58 65  PROT 7.2 8.5*  ALBUMIN 3.8 3.7     Assessment and Plan:  Progressive left axillary lymphadenopathy. Plan for image-guided left axillary  lymph node biopsy today in IR. Patient is NPO. Afebrile. Xarelto held per IR protocol.  Risks and benefits discussed with the patient including, but not limited to bleeding, infection, damage to adjacent structures or low yield requiring additional tests. All of the patient's questions were answered, patient is agreeable to proceed. Consent signed and in chart.   Thank you for this interesting consult.  I greatly enjoyed meeting Russell [name] and look forward to participating in their care.  A copy of this report was sent to the requesting provider on this date.  Electronically Signed: Elwin MochaAlexandra Karielle Davidow, PA-C 10/27/2020, 11:49 AM   I spent a total of 30 Minutes in face to face in clinical consultation, greater than 50% of which was counseling/coordinating care for left axillary  lymphadenopathy/biopsy.

## 2020-10-27 NOTE — Procedures (Signed)
Interventional Radiology Procedure Note  Procedure: US guided biopsy of left axillary lymph node.  Mx 16g core bx  Complications: None EBL: None Recommendations: - Bedrest 1 hours.   - Routine wound care - Follow up pathology - Advance diet   Signed,  Gilmer Mor, DO

## 2020-10-27 NOTE — Discharge Instructions (Addendum)
Needle Biopsy, Care After This sheet gives you information about how to care for yourself after your procedure. Your health care provider may also give you more specific instructions. If you have problems or questions, contact your health care provider. What can I expect after the procedure? After the procedure, it is common to have soreness, bruising, or mild pain at the puncture site. This should go away in a few days. Follow these instructions at home: Needle insertion site care   Wash your hands with soap and water before you change your bandage (dressing). If you cannot use soap and water, use hand sanitizer.  Follow instructions from your health care provider about how to take care of your puncture site. This includes: ? When and how to change your dressing. ? When to remove your dressing.  Check your puncture site every day for signs of infection. Check for: ? Redness, swelling, or pain. ? Fluid or blood. ? Pus or a bad smell. ? Warmth. General instructions  Return to your normal activities as told by your health care provider. Ask your health care provider what activities are safe for you.  Do not take baths, swim, or use a hot tub until your health care provider approves. Ask your health care provider if you may take showers. You may only be allowed to take sponge baths.  Take over-the-counter and prescription medicines only as told by your health care provider.  Keep all follow-up visits as told by your health care provider. This is important. Contact a health care provider if:  You have a fever.  You have redness, swelling, or pain at the puncture site that lasts longer than a few days.  You have fluid, blood, or pus coming from your puncture site.  Your puncture site feels warm to the touch. Get help right away if:  You have severe bleeding from the puncture site. Summary  After the procedure, it is common to have soreness, bruising, or mild pain at the puncture  site. This should go away in a few days.  Check your puncture site every day for signs of infection, such as redness, swelling, or pain.  Get help right away if you have severe bleeding from your puncture site. This information is not intended to replace advice given to you by your health care provider. Make sure you discuss any questions you have with your health care provider. Document Revised: 02/27/2018 Document Reviewed: 12/29/2017 Elsevier Patient Education  2020 Elsevier Inc. Moderate Conscious Sedation, Adult Sedation is the use of medicines to promote relaxation and relieve discomfort and anxiety. Moderate conscious sedation is a type of sedation. Under moderate conscious sedation, you are less alert than normal, but you are still able to respond to instructions, touch, or both. Moderate conscious sedation is used during short medical and dental procedures. It is milder than deep sedation, which is a type of sedation under which you cannot be easily woken up. It is also milder than general anesthesia, which is the use of medicines to make you unconscious. Moderate conscious sedation allows you to return to your regular activities sooner. Tell a health care provider about:  Any allergies you have.  All medicines you are taking, including vitamins, herbs, eye drops, creams, and over-the-counter medicines.  Use of steroids (by mouth or creams).  Any problems you or family members have had with sedatives and anesthetic medicines.  Any blood disorders you have.  Any surgeries you have had.  Any medical conditions you have, such   as sleep apnea.  Whether you are pregnant or may be pregnant.  Any use of cigarettes, alcohol, marijuana, or street drugs. What are the risks? Generally, this is a safe procedure. However, problems may occur, including:  Getting too much medicine (oversedation).  Nausea.  Allergic reaction to medicines.  Trouble breathing. If this happens, a  breathing tube may be used to help with breathing. It will be removed when you are awake and breathing on your own.  Heart trouble.  Lung trouble. What happens before the procedure? Staying hydrated Follow instructions from your health care provider about hydration, which may include:  Up to 2 hours before the procedure - you may continue to drink clear liquids, such as water, clear fruit juice, black coffee, and plain tea. Eating and drinking restrictions Follow instructions from your health care provider about eating and drinking, which may include:  8 hours before the procedure - stop eating heavy meals or foods such as meat, fried foods, or fatty foods.  6 hours before the procedure - stop eating light meals or foods, such as toast or cereal.  6 hours before the procedure - stop drinking milk or drinks that contain milk.  2 hours before the procedure - stop drinking clear liquids. Medicine Ask your health care provider about:  Changing or stopping your regular medicines. This is especially important if you are taking diabetes medicines or blood thinners.  Taking medicines such as aspirin and ibuprofen. These medicines can thin your blood. Do not take these medicines before your procedure if your health care provider instructs you not to.  Tests and exams  You will have a physical exam.  You may have blood tests done to show: ? How well your kidneys and liver are working. ? How well your blood can clot. General instructions  Plan to have someone take you home from the hospital or clinic.  If you will be going home right after the procedure, plan to have someone with you for 24 hours. What happens during the procedure?  An IV tube will be inserted into one of your veins.  Medicine to help you relax (sedative) will be given through the IV tube.  The medical or dental procedure will be performed. What happens after the procedure?  Your blood pressure, heart rate,  breathing rate, and blood oxygen level will be monitored often until the medicines you were given have worn off.  Do not drive for 24 hours. This information is not intended to replace advice given to you by your health care provider. Make sure you discuss any questions you have with your health care provider. Document Revised: 11/28/2017 Document Reviewed: 04/06/2016 Elsevier Patient Education  2020 Elsevier Inc.  

## 2020-10-30 MED FILL — XARELTO 20 MG TABLET: 20 | 30 days supply | Qty: 30 | Fill #3

## 2020-10-31 LAB — SURGICAL PATHOLOGY

## 2020-11-13 DIAGNOSIS — R590 Localized enlarged lymph nodes: Secondary | ICD-10-CM | POA: Diagnosis not present

## 2020-11-27 ENCOUNTER — Telehealth: Payer: Self-pay | Admitting: Internal Medicine

## 2020-11-27 ENCOUNTER — Other Ambulatory Visit: Payer: Self-pay | Admitting: Physician Assistant

## 2020-11-27 MED ORDER — MULTAQ 400 MG PO TABS
400.0000 mg | ORAL_TABLET | Freq: Two times a day (BID) | ORAL | 3 refills | Status: DC
Start: 2020-11-27 — End: 2020-11-27

## 2020-11-27 NOTE — Telephone Encounter (Signed)
Patient calling the office for samples of medication:   1.  What medication and dosage are you requesting samples for? multaq  2.  Are you currently out of this medication? yes

## 2020-11-27 NOTE — Telephone Encounter (Signed)
This is a A-Fib clinic pt 

## 2020-11-29 ENCOUNTER — Telehealth (HOSPITAL_COMMUNITY): Payer: Self-pay

## 2020-11-29 NOTE — Telephone Encounter (Signed)
Patients daughter called and states her father has been out of his Multaq medication since Saturday. She mentioned he has been cutting his dose in half to make his medication last.He is having issues with his insurance company regarding his Multaq medication. His daughter was informed to advise him to come by and pick up samples from the A-fib clinic and to remind him of his appointment to see Eyvonne Left on 12/27 @ 8:30am. She was given the parking code to give to him as well. Consulted with his daughter and she verbalized understanding.

## 2020-12-04 ENCOUNTER — Other Ambulatory Visit (HOSPITAL_COMMUNITY): Payer: Self-pay | Admitting: Physician Assistant

## 2020-12-04 MED FILL — XARELTO 20 MG TABLET: 20 | 30 days supply | Qty: 30 | Fill #0

## 2020-12-25 ENCOUNTER — Other Ambulatory Visit: Payer: Self-pay

## 2020-12-25 ENCOUNTER — Encounter (HOSPITAL_COMMUNITY): Payer: Self-pay | Admitting: Physician Assistant

## 2020-12-25 ENCOUNTER — Ambulatory Visit (HOSPITAL_COMMUNITY)
Admission: RE | Admit: 2020-12-25 | Discharge: 2020-12-25 | Disposition: A | Discharge status: Home / Self Care | Payer: BC Managed Care – PPO | Source: Ambulatory Visit | Attending: Physician Assistant | Admitting: Physician Assistant

## 2020-12-25 ENCOUNTER — Encounter (HOSPITAL_COMMUNITY): Payer: Self-pay

## 2020-12-25 VITALS — BP 118/70 | HR 71 | Ht 70.0 in | Wt 339.6 lb

## 2020-12-25 DIAGNOSIS — Z79899 Other long term (current) drug therapy: Secondary | ICD-10-CM | POA: Diagnosis not present

## 2020-12-25 DIAGNOSIS — E669 Obesity, unspecified: Secondary | ICD-10-CM | POA: Insufficient documentation

## 2020-12-25 DIAGNOSIS — I483 Typical atrial flutter: Secondary | ICD-10-CM | POA: Insufficient documentation

## 2020-12-25 DIAGNOSIS — Z7901 Long term (current) use of anticoagulants: Secondary | ICD-10-CM | POA: Insufficient documentation

## 2020-12-25 DIAGNOSIS — I480 Paroxysmal atrial fibrillation: Secondary | ICD-10-CM | POA: Insufficient documentation

## 2020-12-25 DIAGNOSIS — G4733 Obstructive sleep apnea (adult) (pediatric): Secondary | ICD-10-CM | POA: Diagnosis not present

## 2020-12-25 DIAGNOSIS — Z87891 Personal history of nicotine dependence: Secondary | ICD-10-CM | POA: Insufficient documentation

## 2020-12-25 DIAGNOSIS — Z6841 Body Mass Index (BMI) 40.0 and over, adult: Secondary | ICD-10-CM | POA: Diagnosis not present

## 2020-12-25 NOTE — Progress Notes (Signed)
Primary Care Physician: Patient, No Pcp Per Primary Cardiologist: Dr Leeann Must Primary Electrophysiologist: Dr Ladona Ridgel Referring Physician: [facility]   Russell [name] is a 44 y.o. male with a history of atrial flutter and OSA who presents for follow up in the [facility].  The patient was initially diagnosed with atrial flutter in [place] 08/2017 and has had numerous cardioversions. Caffeine and Sprite appear to have been triggers for him. He has been on amiodarone in the past. Patient is not on long term anticoagulation with a CHADS2VASC score of 0. Patient has not followed up after his previous cardoversions due to a lack of insurance. He was seen in the ER on 03/02/20 with symptoms of palpitations and underwent successful DCCV. He was started on Xarelto. He denies any history of afib. He denies significant alcohol use. He has OSA but has not been on CPAP 2/2 cost.  Patient is s/p atrial flutter ablation with Dr Ladona Ridgel on 04/17/20. He has subsequently developed atrial fibrillation and underwent DCCV in the ED 08/15/20. He felt he was back in afib with symptoms of palpitations and SOB on 08/17/20. He presented to the ED in rate controlled afib. He spontaneously converted in the waiting room.  On follow up today, patient has done well since his last visit. He did have a left axillary abscess and was in and out of afib at that time. He has not had any further afib since then. Of note, CT of his chest showed left axillary lymphadenopathy after abx treatment for his abscess, has seen IR for biopsy. Patient reports his swelling has resolved and no further workup is planned for now.   Today, he denies symptoms of palpitations, chest pain, orthopnea, PND, lower extremity edema, dizziness, presyncope, syncope, bleeding, or neurologic sequela. The patient is tolerating medications without difficulties and is otherwise without complaint today.    Atrial Fibrillation Risk  Factors:  he does have symptoms or diagnosis of sleep apnea. he is not compliant with CPAP therapy. he does not have a history of rheumatic fever. he does have a history of alcohol use. The patient does not have a history of early familial atrial fibrillation or other arrhythmias.  he has a BMI of Body mass index is 48.73 kg/m.[name] Filed Weights   12/25/20 0842  Weight: (!) 154 kg    No family history on file.   Atrial Fibrillation Management history:  Previous antiarrhythmic drugs: amiodarone, Multaq Previous cardioversions: several, 03/02/20, 08/15/20 Previous ablations: none CHADS2VASC score: 0 Anticoagulation history: Xarelto   Past Medical History:  Diagnosis Date  . Atrial fibrillation (HCC)   . Atrial flutter [facility])    Past Surgical History:  Procedure Laterality Date  . A-FLUTTER ABLATION N/A 04/17/2020   Procedure: A-FLUTTER ABLATION;  Surgeon: Marinus Maw, MD;  Location: [facility] INVASIVE CV LAB;  Service: Cardiovascular;  Laterality: N/A;  . arm surgery Left     Current Outpatient Medications  Medication Sig Dispense Refill  . dronedarone (MULTAQ) 400 MG tablet Take 1 tablet (400 mg total) by mouth 2 (two) times daily with a meal. 60 tablet 3  . Multiple Vitamin (MULTIVITAMIN WITH MINERALS) TABS tablet Take 1 tablet by mouth every morning.     No current facility-administered medications for this encounter.    No Known Allergies  Social History   Socioeconomic History  . Marital status: Single    Spouse name: Not on file  . Number of children: Not on file  .  Years of education: Not on file  . Highest education level: Not on file  Occupational History  . Not on file  Tobacco Use  . Smoking status: Former Smoker    Packs/day: 0.00  . Smokeless tobacco: Never Used  Vaping Use  . Vaping Use: Never used  Substance and Sexual Activity  . Alcohol use: Yes    Alcohol/week: 2.0 - 3.0 standard drinks    Types: 2 - 3 Cans of beer per week    Comment: occ  .  Drug use: Yes    Types: Marijuana    Comment: occ  . Sexual activity: Not on file  Other Topics Concern  . Not on file  Social History Narrative  . Not on file   Social Determinants of Health   Financial Resource Strain: Not on file  Food Insecurity: Not on file  Transportation Needs: Not on file  Physical Activity: Not on file  Stress: Not on file  Social Connections: Not on file  Intimate Partner Violence: Not on file     ROS- All systems are reviewed and negative except as per the HPI above.  Physical Exam: Vitals:   12/25/20 0842  BP: 118/70  Pulse: 71  Weight: (!) 154 kg  Height: 5\' 10"  (1.778 m)    GEN- The patient is well appearing obese male, alert and oriented x 3 today.   HEENT-head normocephalic, atraumatic, sclera clear, conjunctiva pink, hearing intact, trachea midline. Lungs- Clear to ausculation bilaterally, normal work of breathing Heart- Regular rate and rhythm, no murmurs, rubs or gallops  GI- soft, NT, ND, + BS Extremities- no clubbing, cyanosis, or edema MS- no significant deformity or atrophy Skin- no rash or lesion Psych- euthymic mood, full affect Neuro- strength and sensation are intact   Wt Readings from Last 3 Encounters:  12/25/20 (!) 154 kg  10/27/20 (!) 142.9 kg  10/16/20 (!) 144.2 kg    EKG today demonstrates SR HR 71, PAC, PR 170, QRS 84, QTc 423  Echo 10/11/20 demonstrates 1. Left ventricular ejection fraction, by estimation, is 55 to 60%. The  left ventricle has normal function. The left ventricle has no regional  wall motion abnormalities. Left ventricular diastolic parameters were  normal.  2. Right ventricular systolic function is normal. The right ventricular  size is normal. Tricuspid regurgitation signal is inadequate for assessing  PA pressure.  3. The mitral valve is normal in structure. No evidence of mitral valve  regurgitation.  4. The aortic valve was not well visualized. Aortic valve regurgitation  is not  visualized. No aortic stenosis is present.  5. The inferior vena cava is normal in size with <50% respiratory  variability, suggesting right atrial pressure of 8 mmHg.   Epic records are reviewed at length today  CHA2DS2-VASc Score = 0  The patient's score is based upon: CHF History: No HTN History: No Diabetes History: No Stroke History: No Vascular Disease History: No      ASSESSMENT AND PLAN: 1. Paroxysmal Atrial Fibrillation/typical atrial flutter The patient's CHA2DS2-VASc score is 0, indicating a 0.2% annual risk of stroke.   S/p atrial flutter ablation 04/17/20 with Dr 04/19/20. S/p DCCV for afib on 08/15/20. Patient appears to be maintaining SR. Continue Multaq 400 mg BID We discussed his CV and his stroke risk. With a CV score of 0, will plan to discontinue Xarelto at this time.   2. Obesity Body mass index is 48.73 kg/m. Lifestyle modification was discussed and encouraged including regular physical activity  and weight reduction.  3. Obstructive sleep apnea Per patient report, planned visit with pulmonology at [facility] to get reestablished and resume CPAP.    Follow up in the AF clinic in 4 months.    Jorja Loa PA-C Afib Clinic [facility] [address] [place], [place] [address] [phone] 12/25/2020 9:02 AM

## 2020-12-25 NOTE — Patient Instructions (Signed)
Stop Xarelto 

## 2021-01-03 MED FILL — MULTAQ 400 MG TABLET: 400 | 30 days supply | Qty: 60 | Fill #0

## 2021-02-08 MED FILL — MULTAQ 400 MG TABLET: 400 | 30 days supply | Qty: 60 | Fill #1

## 2021-03-08 MED FILL — MULTAQ 400 MG TABLET: 400 | 30 days supply | Qty: 60 | Fill #2

## 2021-04-16 ENCOUNTER — Other Ambulatory Visit (HOSPITAL_COMMUNITY): Payer: Self-pay

## 2021-04-16 MED FILL — Dronedarone HCl Tab 400 MG (Base Equivalent): ORAL | 30 days supply | Qty: 60 | Fill #0 | Status: AC

## 2021-04-24 NOTE — Progress Notes (Signed)
Primary Care Physician: Patient, No Pcp Per (Inactive) Primary Cardiologist: Dr Leeann Must Primary Electrophysiologist: Dr Ladona Ridgel Referring Physician: [facility]   Russell [name] is a 46 y.o. male with a history of atrial flutter and OSA who presents for follow up in the [facility].  The patient was initially diagnosed with atrial flutter in [place] 08/2017 and has had numerous cardioversions. Caffeine and Sprite appear to have been triggers for him. He has been on amiodarone in the past. Patient is not on long term anticoagulation with a CHADS2VASC score of 0. Patient has not followed up after his previous cardoversions due to a lack of insurance. He was seen in the ER on 03/02/20 with symptoms of palpitations and underwent successful DCCV. He was started on Xarelto. He denies any history of afib. He denies significant alcohol use. He has OSA but has not been on CPAP 2/2 cost.  Patient is s/p atrial flutter ablation with Dr Ladona Ridgel on 04/17/20. He has subsequently developed atrial fibrillation and underwent DCCV in the ED 08/15/20. He felt he was back in afib with symptoms of palpitations and SOB on 08/17/20. He presented to the ED in rate controlled afib. He spontaneously converted in the waiting room.  On follow up today, patient reports he has done reasonably well since his last visit. His father passed away a couple weeks ago and he has noticed an increase in his palpitations. The episodes are very brief.   Today, he denies symptoms of chest pain, orthopnea, PND, lower extremity edema, dizziness, presyncope, syncope, bleeding, or neurologic sequela. The patient is tolerating medications without difficulties and is otherwise without complaint today.    Atrial Fibrillation Risk Factors:  he does have symptoms or diagnosis of sleep apnea. he is not compliant with CPAP therapy. he does not have a history of rheumatic fever. he does have a history of alcohol use. The  patient does not have a history of early familial atrial fibrillation or other arrhythmias.  he has a BMI of Body mass index is 48.7 kg/m.[name] Filed Weights   04/25/21 0841  Weight: (!) 154 kg    No family history on file.   Atrial Fibrillation Management history:  Previous antiarrhythmic drugs: amiodarone, Multaq Previous cardioversions: several, 03/02/20, 08/15/20 Previous ablations: none CHADS2VASC score: 0 Anticoagulation history: Xarelto   Past Medical History:  Diagnosis Date  . Atrial fibrillation (HCC)   . Atrial flutter [facility])    Past Surgical History:  Procedure Laterality Date  . A-FLUTTER ABLATION N/A 04/17/2020   Procedure: A-FLUTTER ABLATION;  Surgeon: Marinus Maw, MD;  Location: [facility] INVASIVE CV LAB;  Service: Cardiovascular;  Laterality: N/A;  . arm surgery Left     Current Outpatient Medications  Medication Sig Dispense Refill  . acetaminophen (TYLENOL) 650 MG CR tablet Take 650 mg by mouth every 8 (eight) hours as needed for pain.    [name] dronedarone (MULTAQ) 400 MG tablet Take 1 tablet (400 mg total) by mouth 2 (two) times daily with a meal. 60 tablet 3   No current facility-administered medications for this encounter.    No Known Allergies  Social History   Socioeconomic History  . Marital status: Single    Spouse name: Not on file  . Number of children: Not on file  . Years of education: Not on file  . Highest education level: Not on file  Occupational History  . Not on file  Tobacco Use  . Smoking status: Former Smoker  Packs/day: 0.00  . Smokeless tobacco: Never Used  Vaping Use  . Vaping Use: Never used  Substance and Sexual Activity  . Alcohol use: Not Currently    Alcohol/week: 2.0 - 3.0 standard drinks    Types: 2 - 3 Cans of beer per week    Comment: occ  . Drug use: Yes    Types: Marijuana    Comment: daily  . Sexual activity: Not on file  Other Topics Concern  . Not on file  Social History Narrative  . Not on file    Social Determinants of Health   Financial Resource Strain: Not on file  Food Insecurity: Not on file  Transportation Needs: Not on file  Physical Activity: Not on file  Stress: Not on file  Social Connections: Not on file  Intimate Partner Violence: Not on file     ROS- All systems are reviewed and negative except as per the HPI above.  Physical Exam: Vitals:   04/25/21 0841  BP: 118/82  Pulse: 65  Weight: (!) 154 kg  Height: 5\' 10"  (1.778 m)    GEN- The patient is a well appearing obese male, alert and oriented x 3 today.   HEENT-head normocephalic, atraumatic, sclera clear, conjunctiva pink, hearing intact, trachea midline. Lungs- Clear to ausculation bilaterally, normal work of breathing Heart- Regular rate and rhythm, no murmurs, rubs or gallops  GI- soft, NT, ND, + BS Extremities- no clubbing, cyanosis, or edema MS- no significant deformity or atrophy Skin- no rash or lesion Psych- euthymic mood, full affect Neuro- strength and sensation are intact   Wt Readings from Last 3 Encounters:  04/25/21 (!) 154 kg  12/25/20 (!) 154 kg  10/27/20 (!) 142.9 kg    EKG today demonstrates  SR, PACs Vent. rate 65 BPM PR interval 184 ms QRS duration 88 ms QT/QTcB 390/405 ms  Echo 10/11/20 demonstrates 1. Left ventricular ejection fraction, by estimation, is 55 to 60%. The  left ventricle has normal function. The left ventricle has no regional  wall motion abnormalities. Left ventricular diastolic parameters were  normal.  2. Right ventricular systolic function is normal. The right ventricular  size is normal. Tricuspid regurgitation signal is inadequate for assessing  PA pressure.  3. The mitral valve is normal in structure. No evidence of mitral valve  regurgitation.  4. The aortic valve was not well visualized. Aortic valve regurgitation  is not visualized. No aortic stenosis is present.  5. The inferior vena cava is normal in size with <50% respiratory   variability, suggesting right atrial pressure of 8 mmHg.   Epic records are reviewed at length today  CHA2DS2-VASc Score = 0  The patient's score is based upon: CHF History: No HTN History: No Diabetes History: No Stroke History: No Vascular Disease History: No Age Score: 0 Gender Score: 0      ASSESSMENT AND PLAN: 1. Paroxysmal Atrial Fibrillation/typical atrial flutter The patient's CHA2DS2-VASc score is 0, indicating a 0.2% annual risk of stroke.   S/p atrial flutter ablation 04/17/20 with Dr 04/19/20 Patient in SR today. Increased palpitations possibly 2/2 passing of father. Will monitor burden symptomatically.  Continue Multaq 400 mg BID Anticoagulation is not indicated at this time.   2. Obesity Body mass index is 48.7 kg/m. Lifestyle modification was discussed and encouraged including regular physical activity and weight reduction. Patient starting a workout program with daughter.  3. Obstructive sleep apnea Patient would like to establish care with [facility]. He will get copy  of sleep study from [place].    Follow up in the AF clinic in 6 months.    Jorja Loa PA-C Afib Clinic [facility] [address] [place], [place] [address] [phone] 04/25/2021 9:03 AM

## 2021-04-25 ENCOUNTER — Other Ambulatory Visit: Payer: Self-pay

## 2021-04-25 ENCOUNTER — Ambulatory Visit (HOSPITAL_COMMUNITY)
Admission: RE | Admit: 2021-04-25 | Discharge: 2021-04-25 | Disposition: A | Discharge status: Home / Self Care | Payer: BC Managed Care – PPO | Source: Ambulatory Visit | Attending: Physician Assistant | Admitting: Physician Assistant

## 2021-04-25 ENCOUNTER — Encounter (HOSPITAL_COMMUNITY): Payer: Self-pay | Admitting: Physician Assistant

## 2021-04-25 VITALS — BP 118/82 | HR 65 | Ht 70.0 in | Wt 339.4 lb

## 2021-04-25 DIAGNOSIS — I480 Paroxysmal atrial fibrillation: Secondary | ICD-10-CM | POA: Diagnosis not present

## 2021-04-25 DIAGNOSIS — G4733 Obstructive sleep apnea (adult) (pediatric): Secondary | ICD-10-CM | POA: Insufficient documentation

## 2021-04-25 DIAGNOSIS — E669 Obesity, unspecified: Secondary | ICD-10-CM | POA: Diagnosis not present

## 2021-04-25 DIAGNOSIS — Z79899 Other long term (current) drug therapy: Secondary | ICD-10-CM | POA: Insufficient documentation

## 2021-04-25 DIAGNOSIS — Z6841 Body Mass Index (BMI) 40.0 and over, adult: Secondary | ICD-10-CM | POA: Insufficient documentation

## 2021-04-25 DIAGNOSIS — I4892 Unspecified atrial flutter: Secondary | ICD-10-CM | POA: Diagnosis not present

## 2021-04-25 DIAGNOSIS — Z87891 Personal history of nicotine dependence: Secondary | ICD-10-CM | POA: Diagnosis not present

## 2021-05-17 ENCOUNTER — Other Ambulatory Visit (HOSPITAL_COMMUNITY): Payer: Self-pay

## 2021-05-17 ENCOUNTER — Other Ambulatory Visit: Payer: Self-pay | Admitting: Physician Assistant

## 2021-05-17 MED ORDER — MULTAQ 400 MG PO TABS
400.0000 mg | ORAL_TABLET | Freq: Two times a day (BID) | ORAL | 6 refills | Status: DC
Start: 2021-05-17 — End: 2022-03-27
  Filled 2021-05-17: qty 60, 30d supply, fill #0
  Filled 2021-07-06: qty 60, 30d supply, fill #1
  Filled 2021-08-30: qty 60, 30d supply, fill #2
  Filled 2021-10-23: qty 60, 30d supply, fill #3
  Filled 2021-11-28: qty 60, 30d supply, fill #4
  Filled 2022-01-07: qty 60, 30d supply, fill #5
  Filled 2022-02-22: qty 60, 30d supply, fill #6

## 2021-07-06 ENCOUNTER — Other Ambulatory Visit (HOSPITAL_COMMUNITY): Payer: Self-pay

## 2021-08-08 ENCOUNTER — Encounter (HOSPITAL_BASED_OUTPATIENT_CLINIC_OR_DEPARTMENT_OTHER): Payer: Self-pay | Admitting: *Deleted

## 2021-08-08 ENCOUNTER — Other Ambulatory Visit: Payer: Self-pay

## 2021-08-08 ENCOUNTER — Emergency Department (HOSPITAL_BASED_OUTPATIENT_CLINIC_OR_DEPARTMENT_OTHER)
Admission: EM | Admit: 2021-08-08 | Discharge: 2021-08-09 | Disposition: A | Discharge status: Home / Self Care | Payer: BC Managed Care – PPO | Attending: Emergency Medicine | Admitting: Emergency Medicine

## 2021-08-08 DIAGNOSIS — I480 Paroxysmal atrial fibrillation: Secondary | ICD-10-CM | POA: Insufficient documentation

## 2021-08-08 DIAGNOSIS — Z87891 Personal history of nicotine dependence: Secondary | ICD-10-CM | POA: Insufficient documentation

## 2021-08-08 DIAGNOSIS — R1031 Right lower quadrant pain: Secondary | ICD-10-CM | POA: Diagnosis not present

## 2021-08-08 DIAGNOSIS — Z7901 Long term (current) use of anticoagulants: Secondary | ICD-10-CM | POA: Diagnosis not present

## 2021-08-08 DIAGNOSIS — R1030 Lower abdominal pain, unspecified: Secondary | ICD-10-CM | POA: Diagnosis not present

## 2021-08-08 DIAGNOSIS — K429 Umbilical hernia without obstruction or gangrene: Secondary | ICD-10-CM | POA: Diagnosis not present

## 2021-08-08 DIAGNOSIS — R599 Enlarged lymph nodes, unspecified: Secondary | ICD-10-CM | POA: Diagnosis not present

## 2021-08-08 DIAGNOSIS — K5730 Diverticulosis of large intestine without perforation or abscess without bleeding: Secondary | ICD-10-CM | POA: Diagnosis not present

## 2021-08-08 NOTE — ED Triage Notes (Signed)
Right groin pain x 1 week. Reports that he has been working out recently. Denies testicle swelling, denies penile discharged.  Increased pain with certain sitting positions. Denies dysuria.

## 2021-08-09 ENCOUNTER — Emergency Department (HOSPITAL_BASED_OUTPATIENT_CLINIC_OR_DEPARTMENT_OTHER): Payer: BC Managed Care – PPO

## 2021-08-09 DIAGNOSIS — R1031 Right lower quadrant pain: Secondary | ICD-10-CM | POA: Diagnosis not present

## 2021-08-09 DIAGNOSIS — K429 Umbilical hernia without obstruction or gangrene: Secondary | ICD-10-CM | POA: Diagnosis not present

## 2021-08-09 DIAGNOSIS — K5730 Diverticulosis of large intestine without perforation or abscess without bleeding: Secondary | ICD-10-CM | POA: Diagnosis not present

## 2021-08-09 DIAGNOSIS — R599 Enlarged lymph nodes, unspecified: Secondary | ICD-10-CM | POA: Diagnosis not present

## 2021-08-09 NOTE — ED Provider Notes (Signed)
[facility] EMERGENCY DEPARTMENT Provider Note   CSN: [id number] Arrival date & time: 08/08/21  2340     History Chief Complaint  Patient presents with   Groin Pain    Russell [name] is a 45 y.o. male.  HPI     This a 45 year old male with a history of atrial fibrillation who presents with groin pain.  Patient reports 1 week history of worsening groin pain.  He describes pain in his right groin region.  Has not noted any testicular mass, swelling, pain, or penile discharge.  Pain is worse with certain positioning.  Denies hematuria or dysuria.  He has taken Tylenol with minimal relief.  He does not note any specific injury but does state that he has been working out more and could have pulled a muscle.  Reports normal ambulation.  Past Medical History:  Diagnosis Date   Atrial fibrillation [facility])    Atrial flutter [facility])     Patient Active Problem List   Diagnosis Date Noted   Paroxysmal atrial fibrillation (HCC) 08/17/2020   Secondary hypercoagulable state (HCC) 08/17/2020   Typical atrial flutter (HCC) 03/09/2020    Past Surgical History:  Procedure Laterality Date   A-FLUTTER ABLATION N/A 04/17/2020   Procedure: A-FLUTTER ABLATION;  Surgeon: Marinus Maw, MD;  Location: [facility] INVASIVE CV LAB;  Service: Cardiovascular;  Laterality: N/A;   arm surgery Left        History reviewed. No pertinent family history.  Social History   Tobacco Use   Smoking status: Former    Packs/day: 0.00    Types: Cigarettes   Smokeless tobacco: Never  Vaping Use   Vaping Use: Never used  Substance Use Topics   Alcohol use: Not Currently    Alcohol/week: 2.0 - 3.0 standard drinks    Types: 2 - 3 Cans of beer per week    Comment: occ   Drug use: Yes    Types: Marijuana    Comment: daily    Home Medications Prior to Admission medications   Medication Sig Start Date End Date Taking? Authorizing Provider  acetaminophen (TYLENOL) 650 MG CR tablet Take 650 mg by mouth  every 8 (eight) hours as needed for pain.    [provider]  dronedarone (MULTAQ) 400 MG tablet Take 1 tablet (400 mg total) by mouth 2 (two) times daily with a meal. 05/17/21   Fenton, Clint R, PA  apixaban (ELIQUIS) 5 MG TABS tablet Take 1 tablet (5 mg total) by mouth 2 (two) times daily for 30 days. Patient not taking: Reported on 10/03/2019 06/18/19 03/02/20  Gerhard Munch, MD  XARELTO 20 MG TABS tablet TAKE 1 TABLET BY MOUTH DAILY WITH SUPPER 12/04/20 12/25/20  Fenton, Levonne Spiller R, PA    Allergies    Patient has no known allergies.  Review of Systems   Review of Systems  Cardiovascular:  Negative for chest pain.  Gastrointestinal:  Negative for nausea and vomiting.  Genitourinary:  Negative for penile pain, penile swelling and scrotal swelling.       Groin pain  All other systems reviewed and are negative.  Physical Exam Updated Vital Signs BP 111/63 (BP Location: Right Arm)   Pulse (!) 56   Temp 97.7 F (36.5 C) (Oral)   Resp 18   Ht 1.778 m (5\' 10" )   Wt (!) 138.8 kg   SpO2 100%   BMI 43.91 kg/m   Physical Exam Vitals and nursing note reviewed.  Constitutional:  Appearance: He is well-developed. He is obese. He is not ill-appearing.  HENT:     Head: Normocephalic and atraumatic.     Nose: Nose normal.     Mouth/Throat:     Mouth: Mucous membranes are moist.  Eyes:     Pupils: Pupils are equal, round, and reactive to light.  Cardiovascular:     Rate and Rhythm: Normal rate and regular rhythm.  Pulmonary:     Effort: Pulmonary effort is normal. No respiratory distress.  Abdominal:     Palpations: Abdomen is soft.     Tenderness: There is no abdominal tenderness. There is no rebound.  Genitourinary:    Comments: Normal testicular lie, tenderness to palpation right inguinal area without palpable mass, no overlying skin changes Musculoskeletal:     Cervical back: Neck supple.  Lymphadenopathy:     Cervical: No cervical adenopathy.  Skin:    General:  Skin is warm and dry.  Neurological:     Mental Status: He is alert and oriented to person, place, and time.  Psychiatric:        Mood and Affect: Mood normal.    ED Results / Procedures / Treatments   Labs (all labs ordered are listed, but only abnormal results are displayed) Labs Reviewed - No data to display  EKG None  Radiology CT PELVIS WO CONTRAST  Result Date: 08/09/2021 CLINICAL DATA:  Inguinal hernia suspected.  Right groin pain. EXAM: CT PELVIS WITHOUT CONTRAST TECHNIQUE: Multidetector CT imaging of the pelvis was performed following the standard protocol without intravenous contrast. COMPARISON:  None. FINDINGS: Urinary Tract:  No abnormality visualized. Bowel: Sigmoid diverticulosis. Small bowel decompressed, unremarkable. Vascular/Lymphatic: No aneurysm. Few mildly prominent bilateral inguinal lymph nodes. Index right inguinal lymph node has a short axis diameter of 13 mm. Index left inguinal lymph node has a short axis diameter of 11 mm. Reproductive:  No visible focal abnormality. Other: No free fluid or free air. Small umbilical hernia containing fat. No inguinal hernia. Musculoskeletal: 2 no acute bony abnormality. IMPRESSION: No evidence of inguinal hernia. Borderline sized bilateral inguinal lymph nodes. Sigmoid diverticulosis. Small umbilical hernia containing fat. Electronically Signed   By: Charlett Nose M.D.   On: 08/09/2021 00:52    Procedures Procedures   Medications Ordered in ED Medications - No data to display  ED Course  I have reviewed the triage vital signs and the nursing notes.  Pertinent labs & imaging results that were available during my care of the patient were reviewed by me and considered in my medical decision making (see chart for details).    MDM Rules/Calculators/A&P                           Patient presents with groin pain.  It appears positional in nature.  He is overall nontoxic vital signs are reassuring.  Exam is somewhat limited  secondary to his body habitus; however, no palpable hernia.  Denies dysuria.  He has normal testicular lie and no testicular bulge to suggest hernia, epididymitis, or torsion.  Given limited exam, will obtain a CT to rule out hernia.  CT scan does not show any evidence of hernia in the inguinal area.  He does have bilateral inguinal lymph nodes.  He could have some lymphadenopathy.  Recommend ice and ibuprofen.  Avoid heavy lifting.  After history, exam, and medical workup I feel the patient has been appropriately medically screened and is safe for discharge home. Pertinent  diagnoses were discussed with the patient. Patient was given return precautions.  Final Clinical Impression(s) / ED Diagnoses Final diagnoses:  Groin pain, right    Rx / DC Orders ED Discharge Orders     None        Hector Venne, Mayer Masker, MD 08/09/21 0100

## 2021-08-09 NOTE — Discharge Instructions (Addendum)
You were seen today for groin pain.  There is no evidence of hernia on her CT scan.  This could just be a pulled muscle.  Apply ice and take ibuprofen for pain.  Avoid heavy lifting or working out.

## 2021-08-31 ENCOUNTER — Other Ambulatory Visit (HOSPITAL_COMMUNITY): Payer: Self-pay

## 2021-10-23 ENCOUNTER — Other Ambulatory Visit (HOSPITAL_COMMUNITY): Payer: Self-pay

## 2021-10-23 NOTE — Progress Notes (Signed)
Primary Care Physician: Patient, No Pcp Per (Inactive) Primary Cardiologist: Dr Leeann Must Primary Electrophysiologist: Dr Ladona Ridgel Referring Physician: [facility]   Russell [name] is a 45 y.o. male with a history of atrial flutter and OSA who presents for follow up in the [facility].  The patient was initially diagnosed with atrial flutter in [place] 08/2017 and has had numerous cardioversions. Caffeine and Sprite appear to have been triggers for him. He has been on amiodarone in the past. Patient is not on long term anticoagulation with a CHADS2VASC score of 0. Patient has not followed up after his previous cardoversions due to a lack of insurance. He was seen in the ER on 03/02/20 with symptoms of palpitations and underwent successful DCCV. He was started on Xarelto. He denies any history of afib. He denies significant alcohol use. He has OSA but has not been on CPAP 2/2 cost.  Patient is s/p atrial flutter ablation with Dr Ladona Ridgel on 04/17/20. He has subsequently developed atrial fibrillation and underwent DCCV in the ED 08/15/20. He felt he was back in afib with symptoms of palpitations and SOB on 08/17/20. He presented to the ED in rate controlled afib. He spontaneously converted in the waiting room.  On follow up today, patient reports that he has done well since his last visit. He did have one episode of heart racing. He realized he had missed his morning dose of Multaq and immediately went and took it. His heart racing then resolved.   Today, he denies symptoms of chest pain, orthopnea, PND, lower extremity edema, dizziness, presyncope, syncope, bleeding, or neurologic sequela. The patient is tolerating medications without difficulties and is otherwise without complaint today.    Atrial Fibrillation Risk Factors:  he does have symptoms or diagnosis of sleep apnea. he is not compliant with CPAP therapy. he does not have a history of rheumatic fever. he does have  a history of alcohol use. The patient does not have a history of early familial atrial fibrillation or other arrhythmias.  he has a BMI of Body mass index is 46.03 kg/m.[name] Filed Weights   10/24/21 0836  Weight: (!) 145.5 kg     No family history on file.   Atrial Fibrillation Management history:  Previous antiarrhythmic drugs: amiodarone, Multaq Previous cardioversions: several, 03/02/20, 08/15/20 Previous ablations: none CHADS2VASC score: 0 Anticoagulation history: Xarelto   Past Medical History:  Diagnosis Date   Atrial fibrillation (HCC)    Atrial flutter [facility])    Past Surgical History:  Procedure Laterality Date   A-FLUTTER ABLATION N/A 04/17/2020   Procedure: A-FLUTTER ABLATION;  Surgeon: Marinus Maw, MD;  Location: [facility] INVASIVE CV LAB;  Service: Cardiovascular;  Laterality: N/A;   arm surgery Left     Current Outpatient Medications  Medication Sig Dispense Refill   acetaminophen (TYLENOL) 650 MG CR tablet Take 650 mg by mouth every 8 (eight) hours as needed for pain.     dronedarone (MULTAQ) 400 MG tablet Take 1 tablet (400 mg total) by mouth 2 (two) times daily with a meal. 60 tablet 6   No current facility-administered medications for this encounter.    No Known Allergies  Social History   Socioeconomic History   Marital status: Single    Spouse name: Not on file   Number of children: Not on file   Years of education: Not on file   Highest education level: Not on file  Occupational History   Not on file  Tobacco Use   Smoking status: Former    Packs/day: 0.00    Types: Cigarettes   Smokeless tobacco: Never   Tobacco comments:    Former smoker 10/24/2021  Vaping Use   Vaping Use: Never used  Substance and Sexual Activity   Alcohol use: Not Currently    Alcohol/week: 2.0 - 3.0 standard drinks    Types: 2 - 3 Cans of beer per week    Comment: occ   Drug use: Yes    Types: Marijuana    Comment: daily   Sexual activity: Not on file  Other  Topics Concern   Not on file  Social History Narrative   Not on file   Social Determinants of Health   Financial Resource Strain: Not on file  Food Insecurity: Not on file  Transportation Needs: Not on file  Physical Activity: Not on file  Stress: Not on file  Social Connections: Not on file  Intimate Partner Violence: Not on file     ROS- All systems are reviewed and negative except as per the HPI above.  Physical Exam: Vitals:   10/24/21 0836  BP: 122/78  Pulse: 72  Weight: (!) 145.5 kg  Height: 5\' 10"  (1.778 m)    GEN- The patient is a well appearing obese male, alert and oriented x 3 today.   HEENT-head normocephalic, atraumatic, sclera clear, conjunctiva pink, hearing intact, trachea midline. Lungs- Clear to ausculation bilaterally, normal work of breathing Heart- Regular rate and rhythm, no murmurs, rubs or gallops  GI- soft, NT, ND, + BS Extremities- no clubbing, cyanosis, or edema MS- no significant deformity or atrophy Skin- no rash or lesion Psych- euthymic mood, full affect Neuro- strength and sensation are intact   Wt Readings from Last 3 Encounters:  10/24/21 (!) 145.5 kg  08/08/21 (!) 138.8 kg  04/25/21 (!) 154 kg    EKG today demonstrates  SR Vent. rate 72 BPM PR interval 124 ms QRS duration 82 ms QT/QTcB 380/416 ms  Echo 10/11/20 demonstrates 1. Left ventricular ejection fraction, by estimation, is 55 to 60%. The  left ventricle has normal function. The left ventricle has no regional  wall motion abnormalities. Left ventricular diastolic parameters were  normal.   2. Right ventricular systolic function is normal. The right ventricular  size is normal. Tricuspid regurgitation signal is inadequate for assessing  PA pressure.   3. The mitral valve is normal in structure. No evidence of mitral valve  regurgitation.   4. The aortic valve was not well visualized. Aortic valve regurgitation  is not visualized. No aortic stenosis is present.    5. The inferior vena cava is normal in size with <50% respiratory  variability, suggesting right atrial pressure of 8 mmHg.   Epic records are reviewed at length today  CHA2DS2-VASc Score = 0  The patient's score is based upon: CHF History: 0 HTN History: 0 Diabetes History: 0 Stroke History: 0 Vascular Disease History: 0 Age Score: 0 Gender Score: 0      ASSESSMENT AND PLAN: 1. Paroxysmal Atrial Fibrillation/typical atrial flutter The patient's CHA2DS2-VASc score is 0, indicating a 0.2% annual risk of stroke.   S/p atrial flutter ablation 04/17/20 with Dr 04/19/20 Patient appears to be maintaining SR.  Continue Multaq 400 mg BID Anticoagulation is not indicated at this time.   2. Obesity Body mass index is 46.03 kg/m. Lifestyle modification was discussed and encouraged including regular physical activity and weight reduction.  3. Obstructive sleep apnea Previously followed at  [facility] Not on CPAP therapy.    Follow up in the AF clinic in 6 months.    Jorja Loa PA-C Afib Clinic [facility] [address] [place], [place] [address] [phone] 10/24/2021 8:41 AM

## 2021-10-24 ENCOUNTER — Other Ambulatory Visit: Payer: Self-pay

## 2021-10-24 ENCOUNTER — Encounter (HOSPITAL_COMMUNITY): Payer: Self-pay | Admitting: Physician Assistant

## 2021-10-24 ENCOUNTER — Ambulatory Visit (HOSPITAL_COMMUNITY)
Admission: RE | Admit: 2021-10-24 | Discharge: 2021-10-24 | Disposition: A | Discharge status: Home / Self Care | Payer: BC Managed Care – PPO | Source: Ambulatory Visit | Attending: Physician Assistant | Admitting: Physician Assistant

## 2021-10-24 VITALS — BP 122/78 | HR 72 | Ht 70.0 in | Wt 320.8 lb

## 2021-10-24 DIAGNOSIS — Z7182 Exercise counseling: Secondary | ICD-10-CM | POA: Insufficient documentation

## 2021-10-24 DIAGNOSIS — E669 Obesity, unspecified: Secondary | ICD-10-CM | POA: Diagnosis not present

## 2021-10-24 DIAGNOSIS — I480 Paroxysmal atrial fibrillation: Secondary | ICD-10-CM | POA: Insufficient documentation

## 2021-10-24 DIAGNOSIS — Z6841 Body Mass Index (BMI) 40.0 and over, adult: Secondary | ICD-10-CM | POA: Insufficient documentation

## 2021-10-24 DIAGNOSIS — Z87891 Personal history of nicotine dependence: Secondary | ICD-10-CM | POA: Insufficient documentation

## 2021-10-24 DIAGNOSIS — G4733 Obstructive sleep apnea (adult) (pediatric): Secondary | ICD-10-CM | POA: Diagnosis not present

## 2021-10-24 DIAGNOSIS — Z9119 Patient's noncompliance with other medical treatment and regimen due to financial hardship: Secondary | ICD-10-CM | POA: Diagnosis not present

## 2021-10-25 ENCOUNTER — Other Ambulatory Visit: Payer: Self-pay

## 2021-10-25 ENCOUNTER — Other Ambulatory Visit (HOSPITAL_COMMUNITY): Payer: Self-pay

## 2021-10-25 ENCOUNTER — Telehealth: Payer: Self-pay | Admitting: *Deleted

## 2021-10-25 ENCOUNTER — Other Ambulatory Visit (HOSPITAL_BASED_OUTPATIENT_CLINIC_OR_DEPARTMENT_OTHER): Payer: Self-pay

## 2021-10-25 ENCOUNTER — Emergency Department (HOSPITAL_COMMUNITY)
Admission: EM | Admit: 2021-10-25 | Discharge: 2021-10-25 | Disposition: A | Discharge status: Home / Self Care | Payer: BC Managed Care – PPO | Attending: Emergency Medicine | Admitting: Emergency Medicine

## 2021-10-25 ENCOUNTER — Emergency Department (HOSPITAL_COMMUNITY): Payer: BC Managed Care – PPO

## 2021-10-25 DIAGNOSIS — Z87891 Personal history of nicotine dependence: Secondary | ICD-10-CM | POA: Insufficient documentation

## 2021-10-25 DIAGNOSIS — M47814 Spondylosis without myelopathy or radiculopathy, thoracic region: Secondary | ICD-10-CM | POA: Diagnosis not present

## 2021-10-25 DIAGNOSIS — M4184 Other forms of scoliosis, thoracic region: Secondary | ICD-10-CM | POA: Diagnosis not present

## 2021-10-25 DIAGNOSIS — I4891 Unspecified atrial fibrillation: Secondary | ICD-10-CM | POA: Diagnosis not present

## 2021-10-25 DIAGNOSIS — R0781 Pleurodynia: Secondary | ICD-10-CM | POA: Insufficient documentation

## 2021-10-25 DIAGNOSIS — S299XXA Unspecified injury of thorax, initial encounter: Secondary | ICD-10-CM | POA: Diagnosis not present

## 2021-10-25 DIAGNOSIS — Z7901 Long term (current) use of anticoagulants: Secondary | ICD-10-CM | POA: Insufficient documentation

## 2021-10-25 MED ORDER — OXYCODONE-ACETAMINOPHEN 5-325 MG PO TABS
1.0000 | ORAL_TABLET | Freq: Once | ORAL | Status: AC
  Administered 2021-10-25: 1 via ORAL
  Filled 2021-10-25: qty 1

## 2021-10-25 MED ORDER — OXYCODONE-ACETAMINOPHEN 5-325 MG PO TABS
1.0000 | ORAL_TABLET | Freq: Four times a day (QID) | ORAL | 0 refills | Status: DC | PRN
  Filled 2021-10-25 (×2): qty 6, 2d supply, fill #0

## 2021-10-25 MED ORDER — OXYCODONE-ACETAMINOPHEN 5-325 MG PO TABS
1.0000 | ORAL_TABLET | Freq: Four times a day (QID) | ORAL | 0 refills | Status: DC | PRN
  Filled 2021-10-25: qty 6, 2d supply, fill #0

## 2021-10-25 MED ORDER — OXYCODONE-ACETAMINOPHEN 5-325 MG PO TABS
2.0000 | ORAL_TABLET | Freq: Once | ORAL | Status: AC
  Administered 2021-10-25: 2 via ORAL
  Filled 2021-10-25: qty 2

## 2021-10-25 NOTE — ED Provider Notes (Signed)
MSE was initiated and I personally evaluated the patient and placed orders (if any) at  3:56 AM on October 25, 2021.  Patient to ED with sharp, stabbing pain anterior right lower chest after injury while wrestling yesterday.   Today's Vitals   10/25/21 0348 10/25/21 0352  BP:  121/63  Pulse:  68  Resp:  17  Temp:  98 F (36.7 C)  TempSrc:  Oral  SpO2:  97%  Weight: (!) 145.2 kg   Height: 5\' 10"  (1.778 m)   PainSc: 8     Body mass index is 45.92 kg/m.  No chest wall bruising + right Breath sounds  No abdominal tenderness.   The patient appears stable so that the remainder of the MSE may be completed by another provider.   , PA-C 10/25/21 10/27/21    [id number], MD 10/25/21 0700

## 2021-10-25 NOTE — Telephone Encounter (Signed)
Pt called regarding which pharmacy Rx was e-scribed to.  RNCM reviewed chart to access After Visit Summary and found that Rx was at [facility]. Pt requesting it be sent to [facility] instead.  RNCM set request to EDP [facility].

## 2021-10-25 NOTE — ED Triage Notes (Signed)
Pt states playing around with coworkers yesterday when he developed sever pain to his right rib cage. Pt states having hx of broken ribs. Pain associated with SOB.

## 2021-10-25 NOTE — ED Provider Notes (Signed)
[facility] EMERGENCY DEPARTMENT Provider Note   CSN: [id number] Arrival date & time: 10/25/21  0341     History Chief Complaint  Patient presents with   Rib Injury    Russell [name] is a 45 y.o. male.  HPI  Patient presents with anterior rib pain.  Happened acutely yesterday when he was play wrestling with some coworkers.  The pain is reproducible, worse when he breathes or coughs.  The pain does not radiate, feels like a sharp stabbing pain.  There is no associated shortness of breath, nausea, vomiting with it.  He has tried Percocet as an alleviating factor here in the ED, this improved his pain.  Moving is an aggravating factor as well as breathing.  Past Medical History:  Diagnosis Date   Atrial fibrillation [facility])    Atrial flutter [facility])     Patient Active Problem List   Diagnosis Date Noted   Paroxysmal atrial fibrillation (HCC) 08/17/2020   Secondary hypercoagulable state (HCC) 08/17/2020   Typical atrial flutter (HCC) 03/09/2020    Past Surgical History:  Procedure Laterality Date   A-FLUTTER ABLATION N/A 04/17/2020   Procedure: A-FLUTTER ABLATION;  Surgeon: Marinus Maw, MD;  Location: [facility] INVASIVE CV LAB;  Service: Cardiovascular;  Laterality: N/A;   arm surgery Left        No family history on file.  Social History   Tobacco Use   Smoking status: Former    Packs/day: 0.00    Types: Cigarettes   Smokeless tobacco: Never   Tobacco comments:    Former smoker 10/24/2021  Vaping Use   Vaping Use: Never used  Substance Use Topics   Alcohol use: Not Currently    Alcohol/week: 2.0 - 3.0 standard drinks    Types: 2 - 3 Cans of beer per week    Comment: occ   Drug use: Yes    Types: Marijuana    Comment: daily    Home Medications Prior to Admission medications   Medication Sig Start Date End Date Taking? Authorizing Provider  acetaminophen (TYLENOL) 650 MG CR tablet Take 650 mg by mouth every 8 (eight) hours as needed for pain.     [provider]  dronedarone (MULTAQ) 400 MG tablet Take 1 tablet (400 mg total) by mouth 2 (two) times daily with a meal. 05/17/21   Fenton, Clint R, PA  apixaban (ELIQUIS) 5 MG TABS tablet Take 1 tablet (5 mg total) by mouth 2 (two) times daily for 30 days. Patient not taking: Reported on 10/03/2019 06/18/19 03/02/20  Gerhard Munch, MD  XARELTO 20 MG TABS tablet TAKE 1 TABLET BY MOUTH DAILY WITH SUPPER 12/04/20 12/25/20  Fenton, Levonne Spiller R, PA    Allergies    Patient has no known allergies.  Review of Systems   Review of Systems  Constitutional:  Negative for fever.  Respiratory:  Negative for shortness of breath.   Cardiovascular:  Positive for chest pain. Negative for palpitations and leg swelling.  Musculoskeletal:  Positive for myalgias.   Physical Exam Updated Vital Signs BP 107/70 (BP Location: Left Arm)   Pulse 65   Temp 98 F (36.7 C) (Oral)   Resp 19   Ht 5\' 10"  (1.778 m)   Wt (!) 145.2 kg   SpO2 98%   BMI 45.92 kg/m   Physical Exam Vitals and nursing note reviewed. Exam conducted with a chaperone present.  Constitutional:      General: He is not in acute distress.  Appearance: Normal appearance.  HENT:     Head: Normocephalic and atraumatic.  Eyes:     General: No scleral icterus.    Extraocular Movements: Extraocular movements intact.     Pupils: Pupils are equal, round, and reactive to light.  Cardiovascular:     Rate and Rhythm: Normal rate and regular rhythm.  Pulmonary:     Effort: Pulmonary effort is normal.     Breath sounds: Normal breath sounds.     Comments: Lung sounds present bilaterally in all lobes Musculoskeletal:        General: Tenderness present.     Comments: Tenderness to palpation to the right lower anterior chest wall.  Skin:    Coloration: Skin is not jaundiced.     Comments: No contusions or abrasions noted to the chest wall  Neurological:     Mental Status: He is alert. Mental status is at baseline.     Coordination:  Coordination normal.    ED Results / Procedures / Treatments   Labs (all labs ordered are listed, but only abnormal results are displayed) Labs Reviewed - No data to display  EKG None  Radiology DG Ribs Unilateral W/Chest Right  Result Date: 10/25/2021 CLINICAL DATA:  Right posterior rib injury from wrestling EXAM: RIGHT RIBS AND CHEST - 3+ VIEW COMPARISON:  08/15/2020 FINDINGS: One excluding motion degraded images there is no acute fracture or subluxation. Chronic thoracic dextroscoliosis and spondylosis. There is no edema, consolidation, effusion, or pneumothorax. Incidental pericardial calcification inferiorly, also seen by CT in 2021. IMPRESSION: No acute finding. Electronically Signed   By: Tiburcio Pea M.D.   On: 10/25/2021 04:42    Procedures Procedures   Medications Ordered in ED Medications  oxyCODONE-acetaminophen (PERCOCET/ROXICET) 5-325 MG per tablet 1 tablet (has no administration in time range)  oxyCODONE-acetaminophen (PERCOCET/ROXICET) 5-325 MG per tablet 2 tablet (2 tablets Oral Given 10/25/21 0403)    ED Course  I have reviewed the triage vital signs and the nursing notes.  Pertinent labs & imaging results that were available during my care of the patient were reviewed by me and considered in my medical decision making (see chart for details).    MDM Rules/Calculators/A&P                           Patient presents with reproducible right anterior rib pain.  Breath sounds in place, radiograph negative for any signs of pneumothorax.  He did not appear to have a rib fracture, however given the patient's tenderness and his body habitus there is a possibility a posterior rib fracture could have been missed.  I will presume to treat with incentive spirometry and pain medicine.  His vital signs are stable and he is in no acute distress.  No respiratory distress, no indications needing additional work-up at this time.  Patient discharged in stable position.  Final  Clinical Impression(s) / ED Diagnoses Final diagnoses:  None    Rx / DC Orders ED Discharge Orders     None        Theron Arista, PA-C 10/25/21 1223    Pricilla Loveless, MD 10/25/21 [phone]

## 2021-10-25 NOTE — Discharge Instructions (Addendum)
Your x-ray was reassuring, did not show signs of any rib fracture.  However, as we discussed it is possible that you could have an occult fracture.  Given this please take the pain medicine as needed, take Tylenol throughout the day and take the narcotic only at night or when the pain is super severe.  Use the incentive spirometer as often as you can daily, at least twice a day.  Return to the ED if things change or worsen.

## 2021-10-31 DIAGNOSIS — F17210 Nicotine dependence, cigarettes, uncomplicated: Secondary | ICD-10-CM | POA: Diagnosis not present

## 2021-10-31 DIAGNOSIS — Y9383 Activity, rough housing and horseplay: Secondary | ICD-10-CM | POA: Diagnosis not present

## 2021-10-31 DIAGNOSIS — R0602 Shortness of breath: Secondary | ICD-10-CM | POA: Diagnosis not present

## 2021-10-31 DIAGNOSIS — R0789 Other chest pain: Secondary | ICD-10-CM | POA: Diagnosis not present

## 2021-10-31 DIAGNOSIS — S2231XA Fracture of one rib, right side, initial encounter for closed fracture: Secondary | ICD-10-CM | POA: Diagnosis not present

## 2021-10-31 DIAGNOSIS — I4891 Unspecified atrial fibrillation: Secondary | ICD-10-CM | POA: Diagnosis not present

## 2021-10-31 DIAGNOSIS — R0781 Pleurodynia: Secondary | ICD-10-CM | POA: Diagnosis not present

## 2021-10-31 DIAGNOSIS — Z7982 Long term (current) use of aspirin: Secondary | ICD-10-CM | POA: Diagnosis not present

## 2021-11-06 ENCOUNTER — Emergency Department (HOSPITAL_BASED_OUTPATIENT_CLINIC_OR_DEPARTMENT_OTHER): Payer: BC Managed Care – PPO

## 2021-11-06 ENCOUNTER — Encounter (HOSPITAL_BASED_OUTPATIENT_CLINIC_OR_DEPARTMENT_OTHER): Payer: Self-pay

## 2021-11-06 ENCOUNTER — Other Ambulatory Visit: Payer: Self-pay

## 2021-11-06 ENCOUNTER — Emergency Department (HOSPITAL_BASED_OUTPATIENT_CLINIC_OR_DEPARTMENT_OTHER)
Admission: EM | Admit: 2021-11-06 | Discharge: 2021-11-06 | Disposition: A | Discharge status: Home / Self Care | Payer: BC Managed Care – PPO | Attending: Emergency Medicine | Admitting: Emergency Medicine

## 2021-11-06 DIAGNOSIS — R0789 Other chest pain: Secondary | ICD-10-CM

## 2021-11-06 DIAGNOSIS — X58XXXA Exposure to other specified factors, initial encounter: Secondary | ICD-10-CM | POA: Insufficient documentation

## 2021-11-06 DIAGNOSIS — I4891 Unspecified atrial fibrillation: Secondary | ICD-10-CM | POA: Insufficient documentation

## 2021-11-06 DIAGNOSIS — Z87891 Personal history of nicotine dependence: Secondary | ICD-10-CM | POA: Insufficient documentation

## 2021-11-06 DIAGNOSIS — Z20822 Contact with and (suspected) exposure to covid-19: Secondary | ICD-10-CM | POA: Diagnosis not present

## 2021-11-06 DIAGNOSIS — Z7901 Long term (current) use of anticoagulants: Secondary | ICD-10-CM | POA: Insufficient documentation

## 2021-11-06 DIAGNOSIS — R079 Chest pain, unspecified: Secondary | ICD-10-CM | POA: Diagnosis not present

## 2021-11-06 DIAGNOSIS — S20211A Contusion of right front wall of thorax, initial encounter: Secondary | ICD-10-CM | POA: Diagnosis not present

## 2021-11-06 DIAGNOSIS — S299XXA Unspecified injury of thorax, initial encounter: Secondary | ICD-10-CM | POA: Diagnosis not present

## 2021-11-06 DIAGNOSIS — S20211D Contusion of right front wall of thorax, subsequent encounter: Secondary | ICD-10-CM | POA: Insufficient documentation

## 2021-11-06 LAB — RESP PANEL BY RT-PCR (FLU A&B, COVID) ARPGX2
Influenza A by PCR: NEGATIVE
Influenza B by PCR: NEGATIVE
SARS Coronavirus 2 by RT PCR: NEGATIVE

## 2021-11-06 MED ORDER — LIDOCAINE 5 % EX PTCH: 1.0000 | MEDICATED_PATCH | Freq: Every day | CUTANEOUS | 0 refills | Status: DC | PRN

## 2021-11-06 MED ORDER — HYDROCODONE-ACETAMINOPHEN 5-325 MG PO TABS: 1.0000 | ORAL_TABLET | ORAL | 0 refills | Status: DC | PRN

## 2021-11-06 MED ORDER — HYDROCODONE-ACETAMINOPHEN 5-325 MG PO TABS
1.0000 | ORAL_TABLET | Freq: Once | ORAL | Status: AC
  Administered 2021-11-06: 1 via ORAL
  Filled 2021-11-06: qty 1

## 2021-11-06 MED ORDER — KETOROLAC TROMETHAMINE 60 MG/2ML IM SOLN
60.0000 mg | Freq: Once | INTRAMUSCULAR | Status: AC
  Administered 2021-11-06: 60 mg via INTRAMUSCULAR
  Filled 2021-11-06: qty 2

## 2021-11-06 MED ORDER — LIDOCAINE 5 % EX PTCH
1.0000 | MEDICATED_PATCH | CUTANEOUS | Status: DC
  Administered 2021-11-06: 1 via TRANSDERMAL
  Filled 2021-11-06: qty 1

## 2021-11-06 MED ORDER — METHOCARBAMOL 500 MG PO TABS
1000.0000 mg | ORAL_TABLET | Freq: Once | ORAL | Status: AC
  Administered 2021-11-06: 1000 mg via ORAL
  Filled 2021-11-06: qty 2

## 2021-11-06 MED ORDER — METHOCARBAMOL 500 MG PO TABS: 1000.0000 mg | ORAL_TABLET | Freq: Two times a day (BID) | ORAL | 0 refills | Status: AC

## 2021-11-06 NOTE — ED Provider Notes (Signed)
[place] EMERGENCY DEPARTMENT Provider Note   CSN: [id number] Arrival date & time: 11/06/21  1737     History Chief Complaint  Patient presents with   Pain    Russell [name] is a 45 y.o. male.  This is a 45 y.o. malewith significant medical history as below, including afib on multaq who presents to the ED with complaint of chest wall pain after wrestling with some friends, felt popping sensation to right side chest wall/ribs. Evaluated at OSH last week, no rib fx on imaging (contrasted CT/XR), noted was possible developing PNA and was started on doxy, oxycodone. Mx incidental findings noted to which patient has yet to f/u on but as upcoming appt.   Location:  right chest wall Duration:  1.5 wks Onset:  sudden Timing:  intermittent Description:  sharp stabbing pain to right chest wall Severity:  mild Exacerbating/Alleviating Factors:  worse with palpation, rolling over onto side, cough that is nonproductive  Associated Symptoms:  fatigue, reduced PO last 2 days, mild nausea w/o emesis, arthralgias. No dib, no fever/chills. Pertinent Negatives:  no fever/chills, no hx dvt or pe, no sick contacts or recent travel  Context: pt reports still taking doxycycline, oxycodone initially helped but he ran out 2 days ago and pain returned.    The history is provided by the patient. No language interpreter was used.      Past Medical History:  Diagnosis Date   Atrial fibrillation [facility])    Atrial flutter [facility])     Patient Active Problem List   Diagnosis Date Noted   Paroxysmal atrial fibrillation ([place]) 08/17/2020   Secondary hypercoagulable state ([place]) 08/17/2020   Typical atrial flutter ([place]) 03/09/2020    Past Surgical History:  Procedure Laterality Date   A-FLUTTER ABLATION N/A 04/17/2020   Procedure: A-FLUTTER ABLATION;  Surgeon: Evans Lance, MD;  Location: [place] [facility];  Service: Cardiovascular;  Laterality: N/A;   arm surgery Left        No  family history on file.  Social History   Tobacco Use   Smoking status: Former    Packs/day: 0.00    Types: Cigarettes   Smokeless tobacco: Never   Tobacco comments:    Former smoker 10/24/2021  Vaping Use   Vaping Use: Never used  Substance Use Topics   Alcohol use: Not Currently   Drug use: Yes    Types: Marijuana    Home Medications Prior to Admission medications   Medication Sig Start Date End Date Taking? Authorizing Provider  HYDROcodone-acetaminophen (NORCO/VICODIN) 5-325 MG tablet Take 1 tablet by mouth every 4 (four) hours as needed. 11/06/21  Yes Wynona Dove A, DO  lidocaine (LIDODERM) 5 % Place 1 patch onto the skin daily as needed. Remove & Discard patch within 12 hours or as directed by MD 11/06/21  Yes Jeanell Sparrow, DO  methocarbamol (ROBAXIN) 500 MG tablet Take 2 tablets (1,000 mg total) by mouth 2 (two) times daily for 7 days. 11/06/21 11/13/21 Yes Jeanell Sparrow, DO  dronedarone (MULTAQ) 400 MG tablet Take 1 tablet (400 mg total) by mouth 2 (two) times daily with a meal. 05/17/21   Fenton, Clint R, PA  oxyCODONE-acetaminophen (PERCOCET/ROXICET) 5-325 MG tablet Take 1 tablet by mouth every 6 (six) hours as needed for severe pain. 10/25/21   Sherrill Raring, PA-C  apixaban (ELIQUIS) 5 MG TABS tablet Take 1 tablet (5 mg total) by mouth 2 (two) times daily for 30 days. Patient not taking: Reported on  10/03/2019 06/18/19 03/02/20  Gerhard MunchLockwood, Robert, MD  XARELTO 20 MG TABS tablet TAKE 1 TABLET BY MOUTH DAILY WITH SUPPER 12/04/20 12/25/20  Fenton, Clint R, PA    Allergies    Patient has no known allergies.  Review of Systems   Review of Systems  Constitutional:  Positive for appetite change and fatigue. Negative for chills and fever.  HENT:  Negative for facial swelling and trouble swallowing.   Eyes:  Negative for photophobia and visual disturbance.  Respiratory:  Positive for cough. Negative for shortness of breath.   Cardiovascular:  Positive for chest pain. Negative for  palpitations.  Gastrointestinal:  Negative for abdominal pain, nausea and vomiting.  Endocrine: Negative for polydipsia and polyuria.  Genitourinary:  Negative for difficulty urinating and hematuria.  Musculoskeletal:  Negative for gait problem and joint swelling.  Skin:  Negative for pallor and rash.  Neurological:  Negative for syncope and headaches.  Psychiatric/Behavioral:  Negative for agitation and confusion.    Physical Exam Updated Vital Signs BP 109/64 (BP Location: Left Arm)   Pulse 63   Temp 98.1 F (36.7 C) (Oral)   Resp 17   Ht 5\' 10"  (1.778 m)   Wt (!) 147.4 kg   SpO2 100%   BMI 46.63 kg/m   Physical Exam Vitals and nursing note reviewed.  Constitutional:      General: He is not in acute distress.    Appearance: He is well-developed.  HENT:     Head: Normocephalic and atraumatic.     Right Ear: External ear normal.     Left Ear: External ear normal.     Mouth/Throat:     Mouth: Mucous membranes are moist.  Eyes:     General: No scleral icterus. Cardiovascular:     Rate and Rhythm: Normal rate and regular rhythm.     Pulses: Normal pulses.     Heart sounds: Normal heart sounds.  Pulmonary:     Effort: Pulmonary effort is normal. No respiratory distress.     Breath sounds: Normal breath sounds. No decreased breath sounds.  Chest:    Abdominal:     General: Abdomen is flat.     Palpations: Abdomen is soft.     Tenderness: There is no abdominal tenderness.  Musculoskeletal:        General: Normal range of motion.     Cervical back: Normal range of motion.     Right lower leg: No edema.     Left lower leg: No edema.  Skin:    General: Skin is warm and dry.     Capillary Refill: Capillary refill takes less than 2 seconds.  Neurological:     Mental Status: He is alert and oriented to person, place, and time.  Psychiatric:        Mood and Affect: Mood normal.        Behavior: Behavior normal.    ED Results / Procedures / Treatments   Labs (all  labs ordered are listed, but only abnormal results are displayed) Labs Reviewed  RESP PANEL BY RT-PCR (FLU A&B, COVID) [id number]    EKG EKG Interpretation  Date/Time:  Tuesday November 06 2021 18:29:24 EST Ventricular Rate:  68 PR Interval:  176 QRS Duration: 96 QT Interval:  415 QTC Calculation: 442 R Axis:   73 Text Interpretation: Sinus rhythm Confirmed by Tanda RockersGray, Tj Kitchings ([id number]) on 11/06/2021 6:37:06 PM  Radiology DG Ribs Unilateral W/Chest Right  Result Date: 11/06/2021 CLINICAL DATA:  Right-sided chest pain. EXAM:  RIGHT RIBS AND CHEST - 3+ VIEW COMPARISON:  None. FINDINGS: Punctate round metallic density consistent with BB marker. No acute displaced fracture or other bone lesions are seen involving the right ribs. The heart and mediastinal contours are within normal limits. No focal consolidation. No pulmonary edema. No pleural effusion. No pneumothorax. IMPRESSION: No acute cardiopulmonary abnormality. Electronically Signed   By: Iven Finn M.D.   On: 11/06/2021 19:08    Procedures Procedures   Medications Ordered in ED Medications  lidocaine (LIDODERM) 5 % 1 patch (1 patch Transdermal Patch Applied 11/06/21 1830)  methocarbamol (ROBAXIN) tablet 1,000 mg (1,000 mg Oral Given 11/06/21 1831)  ketorolac (TORADOL) injection 60 mg (60 mg Intramuscular Given 11/06/21 1832)  HYDROcodone-acetaminophen (NORCO/VICODIN) 5-325 MG per tablet 1 tablet (1 tablet Oral Given 11/06/21 1831)    ED Course  I have reviewed the triage vital signs and the nursing notes.  Pertinent labs & imaging results that were available during my care of the patient were reviewed by me and considered in my medical decision making (see chart for details).    MDM Rules/Calculators/A&P                           CC: chest wall pain following trauma  This patient complains of above; this involves an extensive number of treatment options and is a complaint that carries with it a high risk of complications and  morbidity. Vital signs were reviewed. Serious etiologies considered.  Discussed repeat imaging with the patient, he feels this is not necessary and he just needs more pain medication. After discussion he is amenable to repeat CXR to evaluation for ongoing discomfort, ekg.   Given recent CT of the chest will not repeat this imaging today. Also underwent laboratory evaluation that was re-assuring at that time as well.   Record review:  Previous records obtained and reviewed   Work up as above, notable for:  Imaging results that were available during my care of the patient were reviewed by me and considered in my medical decision making.   I ordered imaging studies which included CXR and I independently visualized and interpreted imaging which showed no acute process  Management: Give robaxin, lidoderm, norco, toradol., He has IS at home  Reassessment:  Pt reports feeling better. Favor etiology of pain is 2/2 chest wall trauma. No evidence of ischemia on EKG. His HEAR Score is low. Suspicion for ACS is very low at this time. He has appt with PCP in the next few days.    The patient improved significantly and was discharged in stable condition. Detailed discussions were had with the patient regarding current findings, and need for close f/u with PCP or on call doctor. The patient has been instructed to return immediately if the symptoms worsen in any way for re-evaluation. Patient verbalized understanding and is in agreement with current care plan. All questions answered prior to discharge.          This chart was dictated using voice recognition software.  Despite best efforts to proofread,  errors can occur which can change the documentation meaning.  Final Clinical Impression(s) / ED Diagnoses Final diagnoses:  Chest wall pain  Contusion of right chest wall, subsequent encounter    Rx / DC Orders ED Discharge Orders          Ordered    methocarbamol (ROBAXIN) 500 MG tablet   2 times daily  11/06/21 2012    HYDROcodone-acetaminophen (NORCO/VICODIN) 5-325 MG tablet  Every 4 hours PRN        11/06/21 2012    lidocaine (LIDODERM) 5 %  Daily PRN        11/06/21 2012             Sloan Leiter, DO 11/06/21 2331

## 2021-11-06 NOTE — ED Triage Notes (Signed)
Pt c/o pain to right side chest area -states he has been seen x 2 in the last week for same with dx-states he has appt for new PCP 11/15-states he needs "something for the pain"-NAD-steady gait

## 2021-11-06 NOTE — ED Notes (Signed)
Patient transported to X-ray 

## 2021-11-06 NOTE — ED Notes (Signed)
D/c paperwork reviewed with pt, including prescriptions and f/u care. Pt offered a wheelchair to ED exit, to which he declined. No questions or concerns at time of d/c.

## 2021-11-13 DIAGNOSIS — G8929 Other chronic pain: Secondary | ICD-10-CM | POA: Insufficient documentation

## 2021-11-13 DIAGNOSIS — R918 Other nonspecific abnormal finding of lung field: Secondary | ICD-10-CM | POA: Diagnosis not present

## 2021-11-13 DIAGNOSIS — G4733 Obstructive sleep apnea (adult) (pediatric): Secondary | ICD-10-CM | POA: Insufficient documentation

## 2021-11-13 DIAGNOSIS — R0789 Other chest pain: Secondary | ICD-10-CM | POA: Diagnosis not present

## 2021-11-13 DIAGNOSIS — M5442 Lumbago with sciatica, left side: Secondary | ICD-10-CM | POA: Diagnosis not present

## 2021-11-13 DIAGNOSIS — K7689 Other specified diseases of liver: Secondary | ICD-10-CM | POA: Insufficient documentation

## 2021-11-13 DIAGNOSIS — I480 Paroxysmal atrial fibrillation: Secondary | ICD-10-CM | POA: Diagnosis not present

## 2021-11-13 DIAGNOSIS — F331 Major depressive disorder, recurrent, moderate: Secondary | ICD-10-CM | POA: Insufficient documentation

## 2021-11-28 ENCOUNTER — Other Ambulatory Visit (HOSPITAL_COMMUNITY): Payer: Self-pay

## 2021-12-06 DIAGNOSIS — M5442 Lumbago with sciatica, left side: Secondary | ICD-10-CM | POA: Diagnosis not present

## 2021-12-06 DIAGNOSIS — G8929 Other chronic pain: Secondary | ICD-10-CM | POA: Diagnosis not present

## 2021-12-14 ENCOUNTER — Telehealth: Payer: Self-pay

## 2021-12-14 NOTE — Telephone Encounter (Signed)
Attempted to mail new patient paper work twice and it came back, address was verified with patient. Unable to send mail.

## 2021-12-25 DIAGNOSIS — M6281 Muscle weakness (generalized): Secondary | ICD-10-CM | POA: Diagnosis not present

## 2021-12-25 DIAGNOSIS — M5442 Lumbago with sciatica, left side: Secondary | ICD-10-CM | POA: Diagnosis not present

## 2021-12-25 DIAGNOSIS — G8929 Other chronic pain: Secondary | ICD-10-CM | POA: Diagnosis not present

## 2021-12-27 DIAGNOSIS — Z13220 Encounter for screening for lipoid disorders: Secondary | ICD-10-CM | POA: Diagnosis not present

## 2021-12-27 DIAGNOSIS — E66813 Obesity, class 3: Secondary | ICD-10-CM | POA: Insufficient documentation

## 2021-12-27 DIAGNOSIS — Z0000 Encounter for general adult medical examination without abnormal findings: Secondary | ICD-10-CM | POA: Diagnosis not present

## 2021-12-27 DIAGNOSIS — N529 Male erectile dysfunction, unspecified: Secondary | ICD-10-CM | POA: Diagnosis not present

## 2021-12-27 DIAGNOSIS — R399 Unspecified symptoms and signs involving the genitourinary system: Secondary | ICD-10-CM | POA: Diagnosis not present

## 2022-01-01 DIAGNOSIS — M545 Low back pain, unspecified: Secondary | ICD-10-CM | POA: Diagnosis not present

## 2022-01-01 DIAGNOSIS — M6281 Muscle weakness (generalized): Secondary | ICD-10-CM | POA: Diagnosis not present

## 2022-01-03 ENCOUNTER — Ambulatory Visit: Payer: BC Managed Care – PPO | Admitting: Medical

## 2022-01-07 ENCOUNTER — Other Ambulatory Visit (HOSPITAL_COMMUNITY): Payer: Self-pay

## 2022-01-07 DIAGNOSIS — R911 Solitary pulmonary nodule: Secondary | ICD-10-CM | POA: Diagnosis not present

## 2022-01-07 DIAGNOSIS — R0683 Snoring: Secondary | ICD-10-CM | POA: Diagnosis not present

## 2022-01-07 DIAGNOSIS — G4719 Other hypersomnia: Secondary | ICD-10-CM | POA: Diagnosis not present

## 2022-01-14 DIAGNOSIS — K760 Fatty (change of) liver, not elsewhere classified: Secondary | ICD-10-CM | POA: Diagnosis not present

## 2022-01-14 DIAGNOSIS — K7689 Other specified diseases of liver: Secondary | ICD-10-CM | POA: Diagnosis not present

## 2022-02-22 ENCOUNTER — Other Ambulatory Visit (HOSPITAL_BASED_OUTPATIENT_CLINIC_OR_DEPARTMENT_OTHER): Payer: Self-pay

## 2022-03-07 IMAGING — DX DG RIBS W/ CHEST 3+V*R*
4 series · 4 of 4 positions shown · non-contrast
Comparison: None.

CLINICAL DATA: Right-sided chest pain.

EXAM:
RIGHT RIBS AND CHEST - 3+ VIEW

[chest pa]
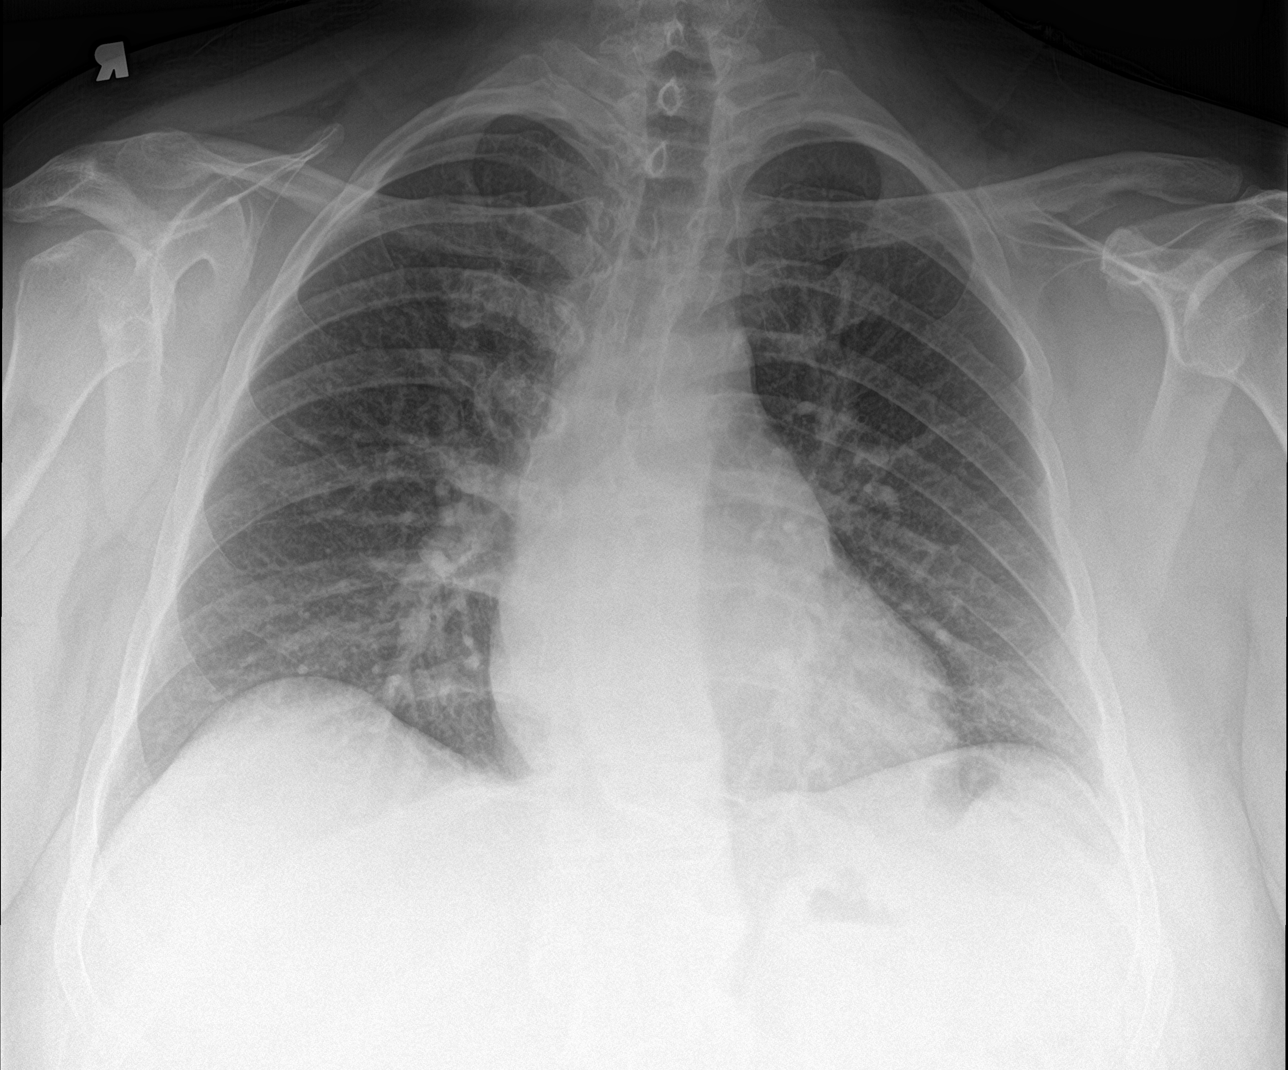

[rib pa (1 of 2)]
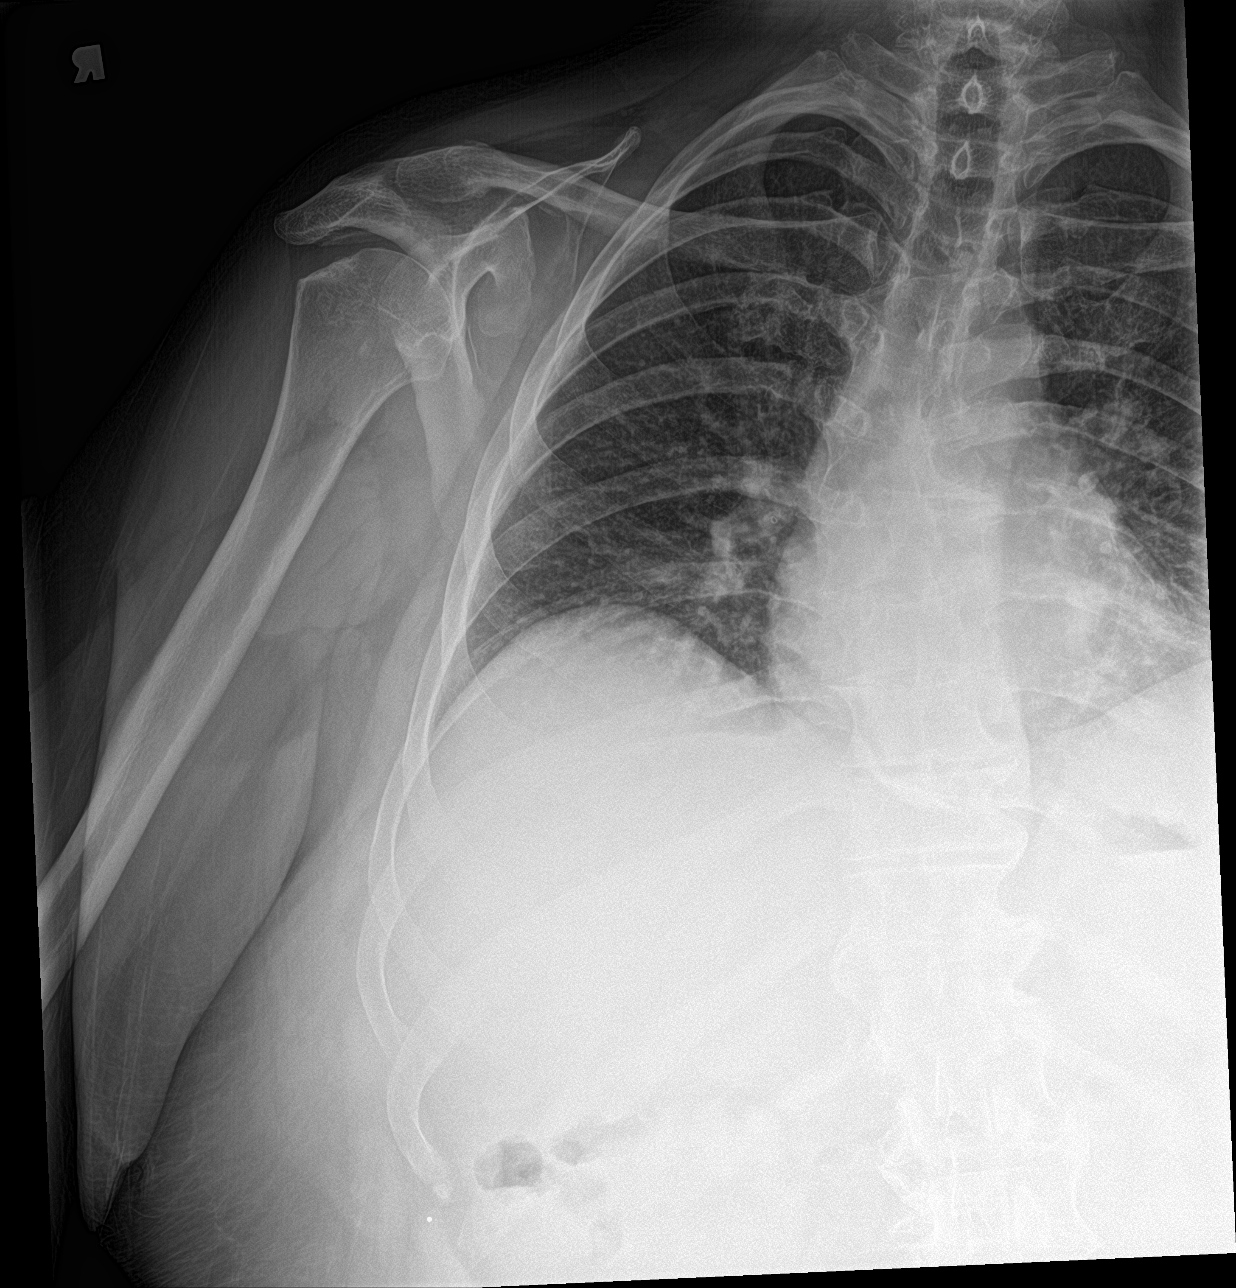

[rib pa obl]
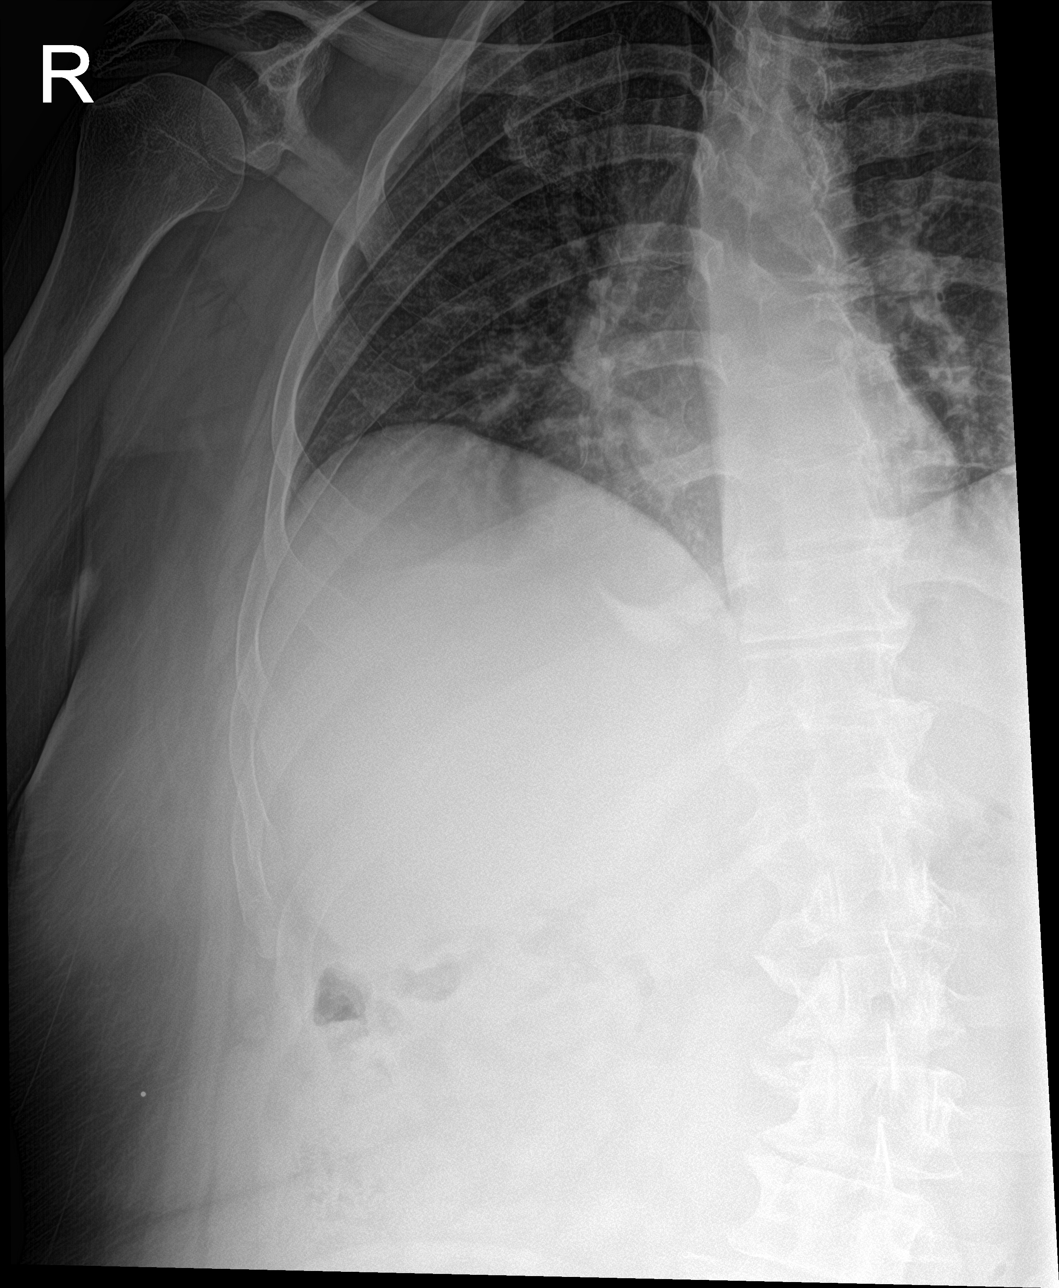

[rib pa (2 of 2)]
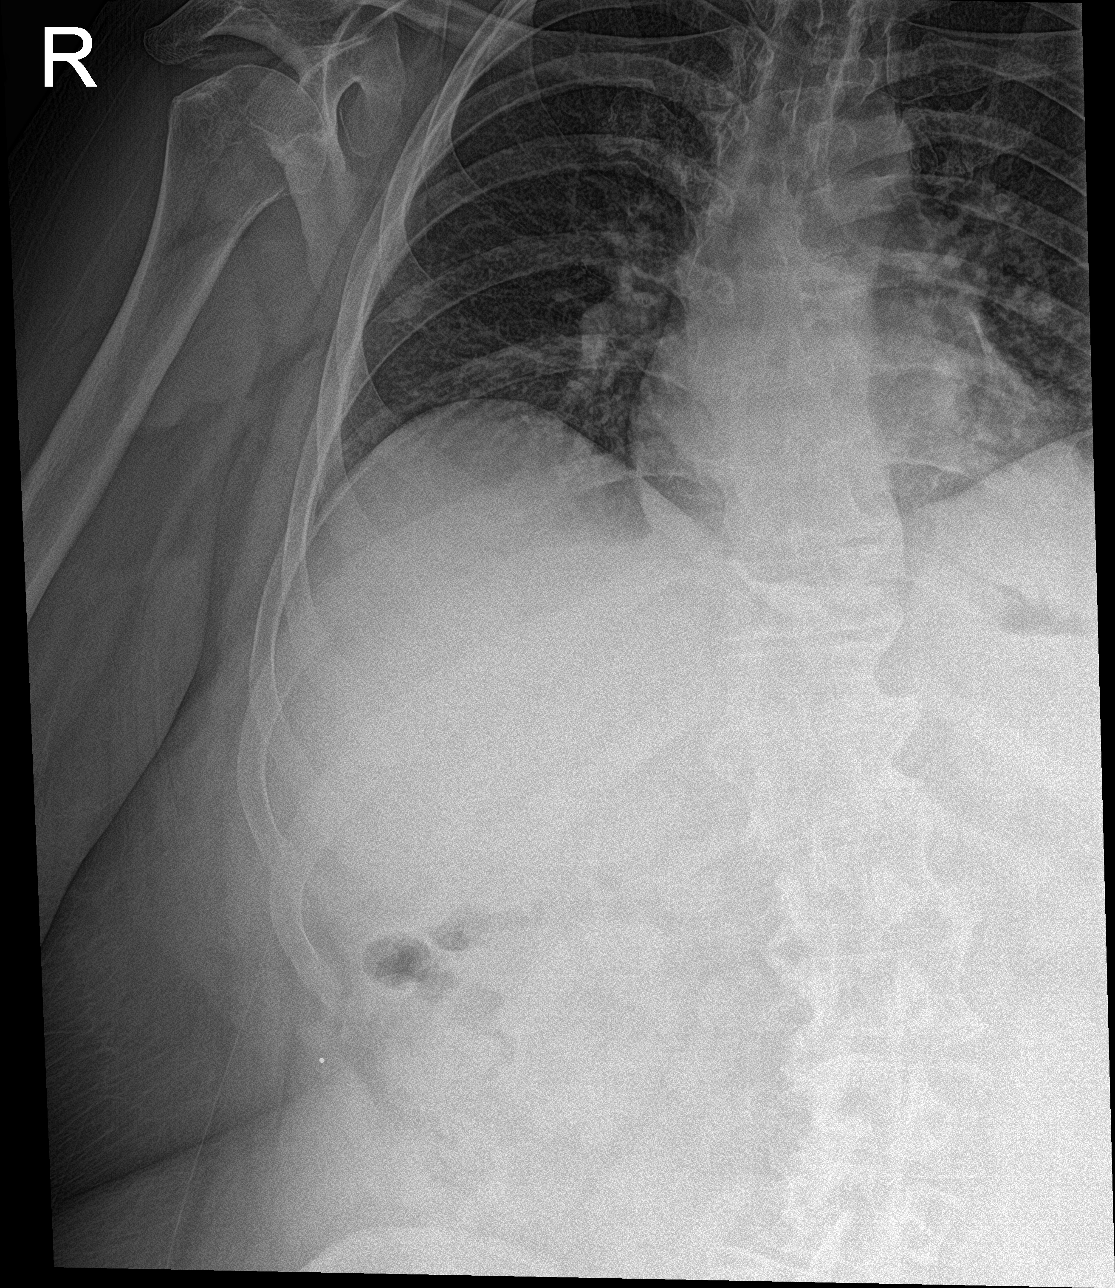

[4 of 4 positions shown; findings below may reference images not displayed]

FINDINGS: Punctate round metallic density consistent with BB marker. No acute
displaced fracture or other bone lesions are seen involving the
right ribs.

The heart and mediastinal contours are within normal limits.

No focal consolidation. No pulmonary edema. No pleural effusion. No
pneumothorax.
IMPRESSION: No acute cardiopulmonary abnormality.

## 2022-03-27 ENCOUNTER — Other Ambulatory Visit: Payer: Self-pay

## 2022-03-27 ENCOUNTER — Emergency Department (HOSPITAL_COMMUNITY)
Admission: EM | Admit: 2022-03-27 | Discharge: 2022-03-28 | Discharge status: Against Medical Advice | Payer: BC Managed Care – PPO | Attending: Emergency Medicine | Admitting: Emergency Medicine

## 2022-03-27 ENCOUNTER — Encounter (HOSPITAL_COMMUNITY): Payer: Self-pay

## 2022-03-27 ENCOUNTER — Other Ambulatory Visit: Payer: Self-pay | Admitting: Physician Assistant

## 2022-03-27 DIAGNOSIS — Z5321 Procedure and treatment not carried out due to patient leaving prior to being seen by health care provider: Secondary | ICD-10-CM | POA: Diagnosis not present

## 2022-03-27 DIAGNOSIS — R0789 Other chest pain: Secondary | ICD-10-CM | POA: Insufficient documentation

## 2022-03-27 DIAGNOSIS — R42 Dizziness and giddiness: Secondary | ICD-10-CM | POA: Insufficient documentation

## 2022-03-27 DIAGNOSIS — R0602 Shortness of breath: Secondary | ICD-10-CM | POA: Insufficient documentation

## 2022-03-27 LAB — TROPONIN I (HIGH SENSITIVITY): Troponin I (High Sensitivity): 22 ng/L — ABNORMAL HIGH (ref <18)

## 2022-03-27 LAB — CBC
HCT: 41.9 % (ref 39.0–52.0)
Hemoglobin: 14.9 g/dL (ref 13.0–17.0)
MCH: 33.7 pg (ref 26.0–34.0)
MCHC: 35.6 g/dL (ref 30.0–36.0)
MCV: 94.8 fL (ref 80.0–100.0)
Platelets: 160 10*3/uL (ref 150–400)
RBC: 4.42 MIL/uL (ref 4.22–5.81)
RDW: 12.5 % (ref 11.5–15.5)
WBC: 8.5 10*3/uL (ref 4.0–10.5)
nRBC: 0 % (ref 0.0–0.2)

## 2022-03-27 LAB — BASIC METABOLIC PANEL
Anion gap: 9 (ref 5–15)
BUN: 23 mg/dL — ABNORMAL HIGH (ref 6–20)
CO2: 22 mmol/L (ref 22–32)
Calcium: 9.2 mg/dL (ref 8.9–10.3)
Chloride: 103 mmol/L (ref 98–111)
Creatinine, Ser: 0.9 mg/dL (ref 0.61–1.24)
GFR, Estimated: >60 mL/min (ref >60)
Glucose, Bld: 97 mg/dL (ref 70–99)
Potassium: 3.7 mmol/L (ref 3.5–5.1)
Sodium: 134 mmol/L — ABNORMAL LOW (ref 135–145)

## 2022-03-27 NOTE — ED Triage Notes (Signed)
Patient states he went into Afib earlier, HR increased above 200. Pt states his HR came back down but he can still feel the flutter in his chest and endorses dizziness with SOB.  ?

## 2022-03-27 NOTE — ED Provider Triage Note (Signed)
Emergency Medicine Provider Triage Evaluation Note ? ?Russell [name] , a 46 y.o. male  was evaluated in triage.  Pt complains of chest pain described as tightness, dizziness, and heaviness in the arms.  States that he was raking leaves earlier today when the symptoms began.  Patient states that he was in A-fib earlier today with a rate approaching 200..  The patient has history of a flutter, proximal A-fib.  Patient did miss his morning Multaq dosage.  Hx of ablation ? ?Review of Systems  ?Positive: Chest tightness with radiation, dizziness ?Negative: Shortness of breath ? ?Physical Exam  ?BP (!) 141/92 (BP Location: Right Arm)   Pulse 75   Temp 97.8 ?F (36.6 ?C) (Oral)   Resp 18   Wt (!) 145.4 kg   SpO2 97%   BMI 46.00 kg/m?  ?Gen:   Awake, no distress   ?Resp:  Normal effort  ?MSK:   Moves extremities without difficulty  ?Other:   ? ?Medical Decision Making  ?Medically screening exam initiated at 8:17 PM.  Appropriate orders placed.  Russell [name] was informed that the remainder of the evaluation will be completed by another provider, this initial triage assessment does not replace that evaluation, and the importance of remaining in the ED until their evaluation is complete. ? ? ?  ?Dorothyann Peng, PA-C ?03/27/22 2019 ? ?

## 2022-03-28 ENCOUNTER — Other Ambulatory Visit (HOSPITAL_BASED_OUTPATIENT_CLINIC_OR_DEPARTMENT_OTHER): Payer: Self-pay

## 2022-03-28 MED ORDER — MULTAQ 400 MG PO TABS
400.0000 mg | ORAL_TABLET | Freq: Two times a day (BID) | ORAL | 6 refills | Status: DC
  Filled 2022-03-28: qty 60, 30d supply, fill #0
  Filled 2022-04-29: qty 60, 30d supply, fill #1
  Filled 2022-05-30: qty 60, 30d supply, fill #2
  Filled 2022-07-01: qty 60, 30d supply, fill #3
  Filled 2022-08-01: qty 60, 30d supply, fill #4
  Filled 2022-09-05: qty 60, 30d supply, fill #5
  Filled 2022-10-07: qty 60, 30d supply, fill #6

## 2022-04-01 ENCOUNTER — Ambulatory Visit (HOSPITAL_COMMUNITY)
Admission: RE | Admit: 2022-04-01 | Discharge: 2022-04-01 | Disposition: A | Discharge status: Home / Self Care | Payer: BC Managed Care – PPO | Source: Ambulatory Visit | Attending: Physician Assistant | Admitting: Physician Assistant

## 2022-04-01 VITALS — BP 116/66 | HR 76 | Ht 70.0 in | Wt 337.0 lb

## 2022-04-01 DIAGNOSIS — Z91199 Patient's noncompliance with other medical treatment and regimen due to unspecified reason: Secondary | ICD-10-CM | POA: Insufficient documentation

## 2022-04-01 DIAGNOSIS — Z7182 Exercise counseling: Secondary | ICD-10-CM | POA: Insufficient documentation

## 2022-04-01 DIAGNOSIS — I483 Typical atrial flutter: Secondary | ICD-10-CM | POA: Insufficient documentation

## 2022-04-01 DIAGNOSIS — Z6841 Body Mass Index (BMI) 40.0 and over, adult: Secondary | ICD-10-CM | POA: Insufficient documentation

## 2022-04-01 DIAGNOSIS — E669 Obesity, unspecified: Secondary | ICD-10-CM | POA: Diagnosis not present

## 2022-04-01 DIAGNOSIS — I480 Paroxysmal atrial fibrillation: Secondary | ICD-10-CM

## 2022-04-01 DIAGNOSIS — G4733 Obstructive sleep apnea (adult) (pediatric): Secondary | ICD-10-CM | POA: Diagnosis not present

## 2022-04-01 DIAGNOSIS — Z79899 Other long term (current) drug therapy: Secondary | ICD-10-CM | POA: Diagnosis not present

## 2022-04-01 DIAGNOSIS — Z597 Insufficient social insurance and welfare support: Secondary | ICD-10-CM | POA: Diagnosis not present

## 2022-04-01 NOTE — Progress Notes (Signed)
? ? ?Primary Care Physician: Patient, No Pcp Per (Inactive) ?Primary Cardiologist: Dr Mauricio Po ?Primary Electrophysiologist: Dr Lovena Le ?Referring Physician: [facility] ? ? ?Russell [name] is a 46 y.o. male with a history of atrial flutter and OSA who presents for follow up in the [facility].  The patient was initially diagnosed with atrial flutter in [place] 08/2017 and has had numerous cardioversions. Caffeine and Sprite appear to have been triggers for him. He has been on amiodarone in the past. Patient is not on long term anticoagulation with a CHADS2VASC score of 0. Patient has not followed up after his previous cardoversions due to a lack of insurance. He was seen in the ER on 03/02/20 with symptoms of palpitations and underwent successful DCCV. He was started on Xarelto. He denies any history of afib. He denies significant alcohol use. He has OSA but has not been on CPAP 2/2 cost. ? ?Patient is s/p atrial flutter ablation with Dr Lovena Le on 04/17/20. He has subsequently developed atrial fibrillation and underwent DCCV in the ED 08/15/20. He felt he was back in afib with symptoms of palpitations and SOB on 08/17/20. He presented to the ED in rate controlled afib. He spontaneously converted in the waiting room. ? ?On follow up today, patient reports he is doing well today. Patient went to ED 03/27/22 with heart racing while raking leaves. He does report he missed a dose of Multaq that morning. He was back in SR at the time of ED evaluation.  ? ?Today, he denies symptoms of palpitations, chest pain, orthopnea, PND, lower extremity edema, dizziness, presyncope, syncope, bleeding, or neurologic sequela. The patient is tolerating medications without difficulties and is otherwise without complaint today.  ? ? ?Atrial Fibrillation Risk Factors: ? ?he does have symptoms or diagnosis of sleep apnea. ?he is not compliant with CPAP therapy. ?he does not have a history of rheumatic fever. ?he does  have a history of alcohol use. ?The patient does not have a history of early familial atrial fibrillation or other arrhythmias. ? ?he has a BMI of Body mass index is 48.35 kg/m?Marland KitchenMarland Kitchen ?Filed Weights  ? 04/01/22 1432  ?Weight: (!) 152.9 kg  ? ? ? ?No family history on file. ? ? ?Atrial Fibrillation Management history: ? ?Previous antiarrhythmic drugs: amiodarone, Multaq ?Previous cardioversions: several, 03/02/20, 08/15/20 ?Previous ablations: none ?CHADS2VASC score: 0 ?Anticoagulation history: Xarelto ? ? ?Past Medical History:  ?Diagnosis Date  ? Atrial fibrillation ([place])   ? Atrial flutter ([place])   ? ?Past Surgical History:  ?Procedure Laterality Date  ? A-FLUTTER ABLATION N/A 04/17/2020  ? Procedure: A-FLUTTER ABLATION;  Surgeon: Evans Lance, MD;  Location: [place] [facility];  Service: Cardiovascular;  Laterality: N/A;  ? arm surgery Left   ? ? ?Current Outpatient Medications  ?Medication Sig Dispense Refill  ? Acetaminophen (TYLENOL PO) Take 3 tablets by mouth as needed. Extra strength Tylenol - 500mg     ? aspirin EC 81 MG tablet Take 81 mg by mouth daily. Swallow whole.    ? dronedarone (MULTAQ) 400 MG tablet Take 1 tablet (400 mg total) by mouth 2 (two) times daily with a meal. 60 tablet 6  ? Multiple Vitamin (ONE-A-DAY MENS PO) Take 1 tablet by mouth in the morning.    ? sertraline (ZOLOFT) 25 MG tablet Take 25 mg by mouth daily.    ? ?No current facility-administered medications for this encounter.  ? ? ?No Known Allergies ? ?Social History  ? ?Socioeconomic History  ?  Marital status: Single  ?  Spouse name: Not on file  ? Number of children: Not on file  ? Years of education: Not on file  ? Highest education level: Not on file  ?Occupational History  ? Not on file  ?Tobacco Use  ? Smoking status: Former  ?  Packs/day: 0.00  ?  Types: Cigarettes  ? Smokeless tobacco: Never  ? Tobacco comments:  ?  Former smoker 10/24/2021  ?Vaping Use  ? Vaping Use: Never used  ?Substance and Sexual Activity  ? Alcohol use: Not  Currently  ? Drug use: Yes  ?  Types: Marijuana  ? Sexual activity: Not on file  ?Other Topics Concern  ? Not on file  ?Social History Narrative  ? Not on file  ? ?Social Determinants of Health  ? ?Financial Resource Strain: Not on file  ?Food Insecurity: Not on file  ?Transportation Needs: Not on file  ?Physical Activity: Not on file  ?Stress: Not on file  ?Social Connections: Not on file  ?Intimate Partner Violence: Not on file  ? ? ? ?ROS- All systems are reviewed and negative except as per the HPI above. ? ?Physical Exam: ?Vitals:  ? 04/01/22 1432  ?BP: 116/66  ?Pulse: 76  ?Weight: (!) 152.9 kg  ?Height: 5\' 10"  (1.778 m)  ? ? ? ?GEN- The patient is a well appearing obese male, alert and oriented x 3 today.   ?HEENT-head normocephalic, atraumatic, sclera clear, conjunctiva pink, hearing intact, trachea midline. ?Lungs- Clear to ausculation bilaterally, normal work of breathing ?Heart- Regular rate and rhythm, no murmurs, rubs or gallops  ?GI- soft, NT, ND, + BS ?Extremities- no clubbing, cyanosis, or edema ?MS- no significant deformity or atrophy ?Skin- no rash or lesion ?Psych- euthymic mood, full affect ?Neuro- strength and sensation are intact ? ? ?Wt Readings from Last 3 Encounters:  ?04/01/22 (!) 152.9 kg  ?03/27/22 (!) 145.4 kg  ?11/06/21 (!) 147.4 kg  ? ? ?EKG today demonstrates  ?SR, PACs ?Vent. rate 76 BPM ?PR interval 182 ms ?QRS duration 92 ms ?QT/QTcB 382/429 ms ? ?Echo 10/11/20 demonstrates ?1. Left ventricular ejection fraction, by estimation, is 55 to 60%. The  ?left ventricle has normal function. The left ventricle has no regional  ?wall motion abnormalities. Left ventricular diastolic parameters were  ?normal.  ? 2. Right ventricular systolic function is normal. The right ventricular  ?size is normal. Tricuspid regurgitation signal is inadequate for assessing PA pressure.  ? 3. The mitral valve is normal in structure. No evidence of mitral valve  ?regurgitation.  ? 4. The aortic valve was not  well visualized. Aortic valve regurgitation  ?is not visualized. No aortic stenosis is present.  ? 5. The inferior vena cava is normal in size with <50% respiratory  ?variability, suggesting right atrial pressure of 8 mmHg.  ? ?Epic records are reviewed at length today ? ?CHA2DS2-VASc Score = 0  ?The patient's score is based upon: ?CHF History: 0 ?HTN History: 0 ?Diabetes History: 0 ?Stroke History: 0 ?Vascular Disease History: 0 ?Age Score: 0 ?Gender Score: 0 ? ?   ? ?ASSESSMENT AND PLAN: ?1. Paroxysmal Atrial Fibrillation/typical atrial flutter ?The patient's CHA2DS2-VASc score is 0, indicating a 0.2% annual risk of stroke.   ?S/p atrial flutter ablation 04/17/20 with Dr Lovena Le ?Overall very low burden of afib.  ?Continue Multaq 400 mg BID ?Anticoagulation is not indicated at this time.  ? ?2. Obesity ?Body mass index is 48.35 kg/m?. ?Lifestyle modification was discussed and encouraged including  regular physical activity and weight reduction. ? ?3. Obstructive sleep apnea ?Not on CPAP therapy.  ? ? ?Follow up in the AF clinic in 6 months.  ? ? ?Ricky Linard Daft PA-C ?Afib Clinic ?[facility] ?[address] ?[place], [place] [address] ?[phone] ?04/01/2022 ?3:05 PM ?

## 2022-04-18 DIAGNOSIS — R790 Abnormal level of blood mineral: Secondary | ICD-10-CM | POA: Diagnosis not present

## 2022-04-18 DIAGNOSIS — M7989 Other specified soft tissue disorders: Secondary | ICD-10-CM | POA: Diagnosis not present

## 2022-04-18 DIAGNOSIS — E871 Hypo-osmolality and hyponatremia: Secondary | ICD-10-CM | POA: Diagnosis not present

## 2022-04-18 DIAGNOSIS — I82811 Embolism and thrombosis of superficial veins of right lower extremities: Secondary | ICD-10-CM | POA: Diagnosis not present

## 2022-04-18 DIAGNOSIS — I480 Paroxysmal atrial fibrillation: Secondary | ICD-10-CM | POA: Diagnosis not present

## 2022-04-18 DIAGNOSIS — I824Y1 Acute embolism and thrombosis of unspecified deep veins of right proximal lower extremity: Secondary | ICD-10-CM | POA: Diagnosis not present

## 2022-04-18 DIAGNOSIS — M79604 Pain in right leg: Secondary | ICD-10-CM | POA: Diagnosis not present

## 2022-04-18 DIAGNOSIS — Z7901 Long term (current) use of anticoagulants: Secondary | ICD-10-CM | POA: Diagnosis not present

## 2022-04-18 DIAGNOSIS — M79661 Pain in right lower leg: Secondary | ICD-10-CM | POA: Diagnosis not present

## 2022-04-18 DIAGNOSIS — D696 Thrombocytopenia, unspecified: Secondary | ICD-10-CM | POA: Diagnosis not present

## 2022-04-18 DIAGNOSIS — R600 Localized edema: Secondary | ICD-10-CM | POA: Diagnosis not present

## 2022-04-18 DIAGNOSIS — D649 Anemia, unspecified: Secondary | ICD-10-CM | POA: Diagnosis not present

## 2022-04-18 DIAGNOSIS — Z87891 Personal history of nicotine dependence: Secondary | ICD-10-CM | POA: Diagnosis not present

## 2022-04-18 DIAGNOSIS — R590 Localized enlarged lymph nodes: Secondary | ICD-10-CM | POA: Diagnosis not present

## 2022-04-18 DIAGNOSIS — M791 Myalgia, unspecified site: Secondary | ICD-10-CM | POA: Diagnosis not present

## 2022-04-19 ENCOUNTER — Other Ambulatory Visit (HOSPITAL_BASED_OUTPATIENT_CLINIC_OR_DEPARTMENT_OTHER): Payer: Self-pay

## 2022-04-24 ENCOUNTER — Ambulatory Visit (HOSPITAL_COMMUNITY): Payer: BC Managed Care – PPO | Admitting: Physician Assistant

## 2022-04-25 DIAGNOSIS — I824Y1 Acute embolism and thrombosis of unspecified deep veins of right proximal lower extremity: Secondary | ICD-10-CM | POA: Diagnosis not present

## 2022-04-25 DIAGNOSIS — Z09 Encounter for follow-up examination after completed treatment for conditions other than malignant neoplasm: Secondary | ICD-10-CM | POA: Diagnosis not present

## 2022-04-25 DIAGNOSIS — Z6841 Body Mass Index (BMI) 40.0 and over, adult: Secondary | ICD-10-CM | POA: Diagnosis not present

## 2022-04-29 ENCOUNTER — Other Ambulatory Visit (HOSPITAL_BASED_OUTPATIENT_CLINIC_OR_DEPARTMENT_OTHER): Payer: Self-pay

## 2022-05-01 DIAGNOSIS — R42 Dizziness and giddiness: Secondary | ICD-10-CM | POA: Diagnosis not present

## 2022-05-01 DIAGNOSIS — Z79899 Other long term (current) drug therapy: Secondary | ICD-10-CM | POA: Diagnosis not present

## 2022-05-01 DIAGNOSIS — T148XXA Other injury of unspecified body region, initial encounter: Secondary | ICD-10-CM | POA: Diagnosis not present

## 2022-05-01 DIAGNOSIS — N62 Hypertrophy of breast: Secondary | ICD-10-CM | POA: Diagnosis not present

## 2022-05-01 DIAGNOSIS — Z7982 Long term (current) use of aspirin: Secondary | ICD-10-CM | POA: Diagnosis not present

## 2022-05-01 DIAGNOSIS — Z86718 Personal history of other venous thrombosis and embolism: Secondary | ICD-10-CM | POA: Diagnosis not present

## 2022-05-01 DIAGNOSIS — K449 Diaphragmatic hernia without obstruction or gangrene: Secondary | ICD-10-CM | POA: Diagnosis not present

## 2022-05-01 DIAGNOSIS — S20211A Contusion of right front wall of thorax, initial encounter: Secondary | ICD-10-CM | POA: Diagnosis not present

## 2022-05-01 DIAGNOSIS — R0789 Other chest pain: Secondary | ICD-10-CM | POA: Diagnosis not present

## 2022-05-01 DIAGNOSIS — Z7901 Long term (current) use of anticoagulants: Secondary | ICD-10-CM | POA: Diagnosis not present

## 2022-05-01 DIAGNOSIS — M7981 Nontraumatic hematoma of soft tissue: Secondary | ICD-10-CM | POA: Diagnosis not present

## 2022-05-01 DIAGNOSIS — Z87891 Personal history of nicotine dependence: Secondary | ICD-10-CM | POA: Diagnosis not present

## 2022-05-01 DIAGNOSIS — X58XXXA Exposure to other specified factors, initial encounter: Secondary | ICD-10-CM | POA: Diagnosis not present

## 2022-05-01 DIAGNOSIS — R918 Other nonspecific abnormal finding of lung field: Secondary | ICD-10-CM | POA: Diagnosis not present

## 2022-05-03 DIAGNOSIS — I318 Other specified diseases of pericardium: Secondary | ICD-10-CM | POA: Insufficient documentation

## 2022-05-03 DIAGNOSIS — S20211D Contusion of right front wall of thorax, subsequent encounter: Secondary | ICD-10-CM | POA: Diagnosis not present

## 2022-05-03 DIAGNOSIS — I82401 Acute embolism and thrombosis of unspecified deep veins of right lower extremity: Secondary | ICD-10-CM | POA: Diagnosis not present

## 2022-05-08 NOTE — Progress Notes (Signed)
?Cardiology Office Note:   ? ?Date:  05/09/2022  ? ?ID:  Russell [name], DOB Aug 24, 1976, MRN [id number] ? ?PCP:  Swatzel, Claude Manges, PA-C  ? ?[facility] Providers ?Cardiologist:  Lenna Sciara, MD ?Referring MD: Evans Lance, MD  ? ?Chief Complaint/Reason for Referral: Establish general cardiovascular care ? ?ASSESSMENT:   ? ?1. Pericardial calcification   ?2. Atrial fibrillation, unspecified type ([place])   ?3. BMI 45.0-49.9, adult ([place])   ?4. Deep vein thrombosis (DVT) of proximal lower extremity, unspecified chronicity, unspecified laterality ([place])   ? ? ?PLAN:   ? ?In order of problems listed above: ?1.  Pericardial calcification: We will obtain echocardiogram to evaluate further.  Follow-up 1 year or earlier if needed. ?2.  Atrial fibrillation: Being followed by atrial fibrillation clinic.  The patient has a low CHA2DS2-VASc score so indefinite anticoagulation is not required. ?3.  Elevated BMI: We will refer to pharmacy for pharmacotherapy recommendations. ?4.  DVT: He recently sustained an unprovoked DVT.  He is on Xarelto for 6 months and this is being managed by his PCP.  Following his treatment with Xarelto hematology consult should be considered to evaluate for thrombophilia given the seemingly unprovoked nature of his DVT.  I discussed this with the patient and he will speak to his PCP about this. ? ?Dispo:  Return in about 1 year (around 05/10/2023).  ? ?  ? ?Medication Adjustments/Labs and Tests Ordered: ?Current medicines are reviewed at length with the patient today.  Concerns regarding medicines are outlined above. ? ?The following changes have been made:  no change  ? ?Labs/tests ordered: ?Orders Placed This Encounter  ?Procedures  ? AMB Referral to [facility] Pharm-D  ? ECHOCARDIOGRAM COMPLETE  ? ? ?Medication Changes: ?No orders of the defined types were placed in this encounter. ? ? ? ?Current medicines are reviewed at length with the patient today.  The patient does not have concerns  regarding medicines. ? ? ?History of Present Illness:   ? ?FOCUSED PROBLEM LIST:   ?1.  Atrial flutter status post ablation 2021 ?2.  Atrial fibrillation with CHA2DS2-VASc score of 0 followed by atrial fibrillation clinic ?3.  BMI of 48 ?4.  DVT April 2023, unprovoked, on Xarelto ?5.  OSA, not yet on CPAP ? ?The patient is a 46 y.o. male with the indicated medical history here to establish general cardiology care.  The patient was seen by his primary care provider recently.  He had developed a DVT and was seen in the emergency department at an outside facility.  He had no inciting factors.  He was started on Xarelto.  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 ? ?He is tolerating Xarelto well.  He denies any significant shortness of breath, chest pain, palpitations, paroxysmal nocturnal dyspnea, orthopnea.  He is working to get fitted for CPAP though he cannot afford the co-pay right now.  He is required no recent emergency room visits or hospitalizations since being in the hospital for his DVT.  He is otherwise well without complaints ? ?  ? ?Current Medications: ?Current Meds  ?Medication Sig  ? dronedarone (MULTAQ) 400 MG tablet Take 1 tablet (400 mg total) by mouth 2 (two) times daily with a meal.  ? Multiple Vitamin (ONE-A-DAY MENS PO) Take 1 tablet by mouth in the  morning.  ? rivaroxaban (XARELTO) 20 MG TABS tablet Take 20 mg by mouth daily with supper.  ? sertraline (ZOLOFT) 25 MG tablet Take 25 mg by mouth daily.  ? [DISCONTINUED] aspirin EC 81 MG tablet Take 81 mg by mouth daily. Swallow whole.  ?  ? ?Allergies:    ?Patient has no known allergies.  ? ?Social History:   ?Social History  ? ?Tobacco Use  ? Smoking status: Former  ?  Packs/day: 0.00  ?  Types: Cigarettes  ? Smokeless tobacco: Never   ? Tobacco comments:  ?  Former smoker 10/24/2021  ?Vaping Use  ? Vaping Use: Never used  ?Substance Use Topics  ? Alcohol use: Not Currently  ? Drug use: Yes  ?  Types: Marijuana  ?  ? ?Family Hx: ?History reviewed. No pertinent family history.  ? ?Review of Systems:   ?Please see the history of present illness.    ?All other systems reviewed and are negative. ?  ? ? ?EKGs/Labs/Other Test Reviewed:   ? ?EKG:  EKG performed 20-Apr-2022 that I personally reviewed demonstrates sinus rhythm ? ?Prior CV studies: ? ?TTE 2020/04/20 with ejection fraction 55 to 60% with no significant valvular abnormalities ? ?Other studies Reviewed: ?Review of the additional studies/records demonstrates: CT April 20, 2020 with pericardial calcification ? ?Recent Labs: ?03/27/2022: BUN 23; Creatinine, Ser 0.90; Hemoglobin 14.9; Platelets 160; Potassium 3.7; Sodium 134  ? ?Recent Lipid Panel ?No results found for: CHOL, TRIG, HDL, LDLCALC, LDLDIRECT ? ?Risk Assessment/Calculations:   ? ? ?CHA2DS2-VASc Score = 0  ? This indicates a 0.2% annual risk of stroke. ?The patient's score is based upon: ?CHF History: 0 ?HTN History: 0 ?Diabetes History: 0 ?Stroke History: 0 ?Vascular Disease History: 0 ?Age Score: 0 ?Gender Score: 0 ?  ?  ?    ? ?Physical Exam:   ? ?VS:  BP 114/76   Pulse 67   Ht 5\' 10"  (1.778 m)   Wt (!) 334 lb 3.2 oz (151.6 kg)   SpO2 97%   BMI 47.95 kg/m?    ?Wt Readings from Last 3 Encounters:  ?05/09/22 (!) 334 lb 3.2 oz (151.6 kg)  ?04/01/22 (!) 337 lb (152.9 kg)  ?03/27/22 (!) 320 lb 9.6 oz (145.4 kg)  ?  ?GENERAL:  No apparent distress, AOx3 ?HEENT:  No carotid bruits, +2 carotid impulses, no scleral icterus ?CAR: RRR no murmurs, gallops, rubs, or thrills ?RES:  Clear to auscultation bilaterally ?ABD:  Soft, nontender, nondistended, positive bowel sounds x 4 ?VASC:  +2 radial pulses, +2 carotid pulses, palpable pedal pulses ?NEURO:  CN 2-12 grossly intact; motor and sensory grossly intact ?PSYCH:  No active depression or anxiety ?EXT:  No  edema, ecchymosis, or cyanosis ? ?Signed, ?Early Osmond, MD  ?05/09/2022 11:52 AM    ?[facility] ?[address], [place], [place]  [address] ?Phone: [phone]; Fax: [phone]  ? ?Note:  This document was prepared using Dragon voice recognition software and may include unintentional dictation errors. ?

## 2022-05-09 ENCOUNTER — Ambulatory Visit: Payer: BC Managed Care – PPO | Admitting: Internal Medicine

## 2022-05-09 ENCOUNTER — Encounter: Payer: Self-pay | Admitting: Internal Medicine

## 2022-05-09 VITALS — BP 114/76 | HR 67 | Ht 70.0 in | Wt 334.2 lb

## 2022-05-09 DIAGNOSIS — I4891 Unspecified atrial fibrillation: Secondary | ICD-10-CM

## 2022-05-09 DIAGNOSIS — Z6841 Body Mass Index (BMI) 40.0 and over, adult: Secondary | ICD-10-CM

## 2022-05-09 DIAGNOSIS — I824Y9 Acute embolism and thrombosis of unspecified deep veins of unspecified proximal lower extremity: Secondary | ICD-10-CM

## 2022-05-09 DIAGNOSIS — I318 Other specified diseases of pericardium: Secondary | ICD-10-CM | POA: Diagnosis not present

## 2022-05-09 NOTE — Patient Instructions (Addendum)
Medication Instructions:  ?Stop aspirin ?*If you need a refill on your cardiac medications before your next appointment, please call your pharmacy* ? ? ?Lab Work: ?none ?If you have labs (blood work) drawn today and your tests are completely normal, you will receive your results only by: ?MyChart Message (if you have MyChart) OR ?A paper copy in the mail ?If you have any lab test that is abnormal or we need to change your treatment, we will call you to review the results. ? ? ?Testing/Procedures: ?Your physician has requested that you have an echocardiogram. Echocardiography is a painless test that uses sound waves to create images of your heart. It provides your doctor with information about the size and shape of your heart and how well your heart?s chambers and valves are working. This procedure takes approximately one hour. There are no restrictions for this procedure. ? ? ? ?Follow-Up: ?At [facility], you and your health needs are our priority.  As part of our continuing mission to provide you with exceptional heart care, we have created designated Provider Care Teams.  These Care Teams include your primary Cardiologist (physician) and Advanced Practice Providers (APPs -  Physician Assistants and Nurse Practitioners) who all work together to provide you with the care you need, when you need it. ? ?We recommend signing up for the patient portal called "MyChart".  Sign up information is provided on this After Visit Summary.  MyChart is used to connect with patients for Virtual Visits (Telemedicine).  Patients are able to view lab/test results, encounter notes, upcoming appointments, etc.  Non-urgent messages can be sent to your provider as well.   ?To learn more about what you can do with MyChart, go to [email or web address].   ? ?Your next appointment:   ?12 month(s) ? ?The format for your next appointment:   ?In Person ? ?Provider:   ?Lenna Sciara, MD ? ?Other Information: ?You have been referred to  Clinical PharmD for elevated BMI ? ? ?Important Information About Sugar ? ? ? ? ?  ?

## 2022-05-30 ENCOUNTER — Other Ambulatory Visit (HOSPITAL_BASED_OUTPATIENT_CLINIC_OR_DEPARTMENT_OTHER): Payer: Self-pay

## 2022-05-31 DIAGNOSIS — Z87891 Personal history of nicotine dependence: Secondary | ICD-10-CM | POA: Diagnosis not present

## 2022-05-31 DIAGNOSIS — Z7982 Long term (current) use of aspirin: Secondary | ICD-10-CM | POA: Diagnosis not present

## 2022-05-31 DIAGNOSIS — J984 Other disorders of lung: Secondary | ICD-10-CM | POA: Diagnosis not present

## 2022-05-31 DIAGNOSIS — M79605 Pain in left leg: Secondary | ICD-10-CM | POA: Diagnosis not present

## 2022-05-31 DIAGNOSIS — M79662 Pain in left lower leg: Secondary | ICD-10-CM | POA: Diagnosis not present

## 2022-05-31 DIAGNOSIS — R112 Nausea with vomiting, unspecified: Secondary | ICD-10-CM | POA: Diagnosis not present

## 2022-05-31 DIAGNOSIS — Z79899 Other long term (current) drug therapy: Secondary | ICD-10-CM | POA: Diagnosis not present

## 2022-05-31 DIAGNOSIS — I4891 Unspecified atrial fibrillation: Secondary | ICD-10-CM | POA: Diagnosis not present

## 2022-05-31 DIAGNOSIS — R059 Cough, unspecified: Secondary | ICD-10-CM | POA: Diagnosis not present

## 2022-05-31 DIAGNOSIS — Z7901 Long term (current) use of anticoagulants: Secondary | ICD-10-CM | POA: Diagnosis not present

## 2022-06-02 ENCOUNTER — Encounter (HOSPITAL_BASED_OUTPATIENT_CLINIC_OR_DEPARTMENT_OTHER): Payer: Self-pay | Admitting: Emergency Medicine

## 2022-06-02 ENCOUNTER — Other Ambulatory Visit: Payer: Self-pay

## 2022-06-02 ENCOUNTER — Emergency Department (HOSPITAL_BASED_OUTPATIENT_CLINIC_OR_DEPARTMENT_OTHER)
Admission: EM | Admit: 2022-06-02 | Discharge: 2022-06-02 | Disposition: A | Discharge status: Home / Self Care | Payer: BC Managed Care – PPO | Attending: Physician Assistant | Admitting: Physician Assistant

## 2022-06-02 DIAGNOSIS — M79605 Pain in left leg: Secondary | ICD-10-CM | POA: Diagnosis not present

## 2022-06-02 DIAGNOSIS — D72829 Elevated white blood cell count, unspecified: Secondary | ICD-10-CM | POA: Diagnosis not present

## 2022-06-02 DIAGNOSIS — L539 Erythematous condition, unspecified: Secondary | ICD-10-CM | POA: Diagnosis not present

## 2022-06-02 DIAGNOSIS — M7989 Other specified soft tissue disorders: Secondary | ICD-10-CM | POA: Diagnosis not present

## 2022-06-02 DIAGNOSIS — L03116 Cellulitis of left lower limb: Secondary | ICD-10-CM | POA: Diagnosis not present

## 2022-06-02 DIAGNOSIS — I480 Paroxysmal atrial fibrillation: Secondary | ICD-10-CM | POA: Diagnosis not present

## 2022-06-02 DIAGNOSIS — R7309 Other abnormal glucose: Secondary | ICD-10-CM | POA: Diagnosis not present

## 2022-06-02 DIAGNOSIS — Z7901 Long term (current) use of anticoagulants: Secondary | ICD-10-CM | POA: Diagnosis not present

## 2022-06-02 LAB — CBG MONITORING, ED: Glucose-Capillary: 117 mg/dL — ABNORMAL HIGH (ref 70–99)

## 2022-06-02 MED ORDER — LIDOCAINE HCL (PF) 1 % IJ SOLN
INTRAMUSCULAR | Status: AC
  Filled 2022-06-02: qty 5

## 2022-06-02 MED ORDER — CEPHALEXIN 250 MG PO CAPS
500.0000 mg | ORAL_CAPSULE | Freq: Once | ORAL | Status: AC
  Administered 2022-06-02: 500 mg via ORAL
  Filled 2022-06-02: qty 2

## 2022-06-02 MED ORDER — CEPHALEXIN 500 MG PO CAPS
500.0000 mg | ORAL_CAPSULE | Freq: Four times a day (QID) | ORAL | 0 refills | Status: DC
  Filled 2022-06-03: qty 40, 10d supply, fill #0

## 2022-06-02 MED ORDER — CEFTRIAXONE SODIUM 1 G IJ SOLR
1.0000 g | Freq: Once | INTRAMUSCULAR | Status: AC
  Administered 2022-06-02: 1 g via INTRAMUSCULAR
  Filled 2022-06-02: qty 10

## 2022-06-02 NOTE — Discharge Instructions (Signed)
Begin taking Keflex as prescribed.  Continue other medications as previously prescribed.  Follow-up with primary doctor if not improving in the next few days, and return to the ER if you develop worsening redness, worsening pain, high fevers, or other new and concerning symptoms.

## 2022-06-02 NOTE — ED Provider Notes (Signed)
[facility] EMERGENCY DEPARTMENT Provider Note   CSN: [id number] Arrival date & time: 06/02/22  0539     History  Chief Complaint  Patient presents with   Leg Pain    Russell FREESE is a 46 y.o. male.  Patient is a 46 year old male with past medical history of atrial flutter, paroxysmal A-fib, prior DVT on Xarelto, and obesity.  Patient presenting today with complaints of left leg pain, redness.  This started 3 days ago.  He was seen at [facility] 2 days ago and underwent ultrasound, laboratory studies, and COVID testing, all of which were negative.  He presents here today gel feeling warmth, burning, and reddish discoloration to his left leg.  He has had cellulitis in the past and believes that this is recurring.  He denies any aggravating or alleviating factors.  The history is provided by the patient.      Home Medications Prior to Admission medications   Medication Sig Start Date End Date Taking? Authorizing Provider  Acetaminophen (TYLENOL PO) Take 3 tablets by mouth as needed. Extra strength Tylenol - 500mg  Patient not taking: Reported on 05/09/2022    [provider]  dronedarone (MULTAQ) 400 MG tablet Take 1 tablet (400 mg total) by mouth 2 (two) times daily with a meal. 03/28/22   Fenton, Clint R, PA  Multiple Vitamin (ONE-A-DAY MENS PO) Take 1 tablet by mouth in the morning.    [provider]  rivaroxaban (XARELTO) 20 MG TABS tablet Take 20 mg by mouth daily with supper.    [provider]  sertraline (ZOLOFT) 25 MG tablet Take 25 mg by mouth daily. 11/13/21   [provider]  apixaban (ELIQUIS) 5 MG TABS tablet Take 1 tablet (5 mg total) by mouth 2 (two) times daily for 30 days. 06/18/19 03/02/20  Carmin Muskrat, MD      Allergies    Patient has no known allergies.    Review of Systems   Review of Systems  All other systems reviewed and are negative.  Physical Exam Updated Vital Signs BP 123/80   Pulse 75   Temp  98.1 F (36.7 C)   Resp 18   Ht 5\' 10"  (1.778 m)   Wt (!) 152 kg   SpO2 98%   BMI 48.07 kg/m  Physical Exam Vitals and nursing note reviewed.  Constitutional:      General: He is not in acute distress.    Appearance: Normal appearance. He is not ill-appearing.  HENT:     Head: Normocephalic and atraumatic.  Pulmonary:     Effort: Pulmonary effort is normal.  Musculoskeletal:     Comments: To the medial aspect of the left lower leg, there is an area of erythema and warmth.  This area is tender to the touch.  DP pulses are palpable and motor and sensation are intact throughout the foot and distal leg.  Skin:    General: Skin is warm and dry.  Neurological:     Mental Status: He is alert.    ED Results / Procedures / Treatments   Labs (all labs ordered are listed, but only abnormal results are displayed) Labs Reviewed  CBG MONITORING, ED    EKG None  Radiology No results found.  Procedures Procedures    Medications Ordered in ED Medications  cefTRIAXone (ROCEPHIN) injection 1 g (has no administration in time range)  cephALEXin (KEFLEX) capsule 500 mg (has no administration in time range)    ED Course/ Medical Decision  Making/ A&P  Patient presenting with complaints of leg pain, swelling, and discoloration as described in the HPI.  This is most consistent with cellulitis.  Laboratory studies and work-up from [facility] reviewed.  He had a white count of 13,000 2 days ago, but laboratory studies were otherwise unremarkable.  I do not feel as though these need to be repeated.  Patient will be treated for cellulitis with Rocephin and Keflex.  To follow-up as needed for any problems..    Final Clinical Impression(s) / ED Diagnoses Final diagnoses:  None    Rx / DC Orders ED Discharge Orders     None         Veryl Speak, MD 06/02/22 [phone]

## 2022-06-02 NOTE — ED Triage Notes (Signed)
Pt states he was seen Friday for leg pain that continues today. Pt had blood work, XR and [place] to r/o DVT. He states "they told me there was nothing wrong with me but both my legs hurt and are red on the calves". Pt is able to stand and get into stretcher without assistance despite being brought back in a wheelchair. Multiple varicose veins to BIL LE.

## 2022-06-03 ENCOUNTER — Ambulatory Visit (HOSPITAL_COMMUNITY): Payer: BC Managed Care – PPO | Attending: Cardiovascular Disease

## 2022-06-03 ENCOUNTER — Other Ambulatory Visit (HOSPITAL_BASED_OUTPATIENT_CLINIC_OR_DEPARTMENT_OTHER): Payer: Self-pay

## 2022-06-03 ENCOUNTER — Telehealth: Payer: Self-pay | Admitting: Pharmacist

## 2022-06-03 ENCOUNTER — Ambulatory Visit: Payer: BC Managed Care – PPO | Admitting: Pharmacist

## 2022-06-03 DIAGNOSIS — Z6841 Body Mass Index (BMI) 40.0 and over, adult: Secondary | ICD-10-CM

## 2022-06-03 DIAGNOSIS — I318 Other specified diseases of pericardium: Secondary | ICD-10-CM

## 2022-06-03 DIAGNOSIS — I4891 Unspecified atrial fibrillation: Secondary | ICD-10-CM

## 2022-06-03 LAB — ECHOCARDIOGRAM COMPLETE
Area-P 1/2: 5.01 cm2
S' Lateral: 2.9 cm

## 2022-06-03 MED ORDER — PERFLUTREN LIPID MICROSPHERE
1.0000 mL | INTRAVENOUS | Status: AC | PRN
  Administered 2022-06-03: 3 mL via INTRAVENOUS

## 2022-06-03 NOTE — Progress Notes (Signed)
Patient ID: [name]                 DOB: 30-Jun-1976                    MRN: [id number]     HPI: Russell [name] is a 46 y.o. male patient referred to pharmacy clinic by Dr. Ali Lowe to initiate weight loss therapy with GLP1-RA. PMH is significant for obesity, pericardial calcification, Afib s/p ablation 03/2020, OSA, and DVT since 03/2022. Most recent BMI was 48.07 on 06/02/22. Patient is currently being treated for an unprovoked DVT by PCP with Xarelto x 6 months. Patient presented to the ED on 05/01/22 for chest wall contusion. Patient was last seen by Dr. Ali Lowe on 05/09/22 at which time an echo was ordered for today to evaluate pericardial calcification. Patient reported to the ED on 05/31/22 and 06/02/22 for cellulitis and was discharged on oral antibiotics.   Today, pt expresses recent difficulty adhering to a healthy lifestyle, but is willing to make adjustments to diet and exercise. 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 He endorses a poor diet, however is interested in incorporating healthier foods.  Pt denies any family hx of thyroid cancer, pancreatitis, and gall stones. Pt expressed motivation to lose weight and would like to start a GLP-1 RA to help facilitate weight loss adjunct to diet and exercise. Pt denies any history of DM, and has A1c screening scheduled for next week with PCP.    Pt mentioned being cigarette-free for over 2 years and was congratulated for his success.  Current weight management medications: none  Previously tried meds: none  Current meds that may affect weight: sertraline  Baseline weight/BMI:  335 Ibs/ 48.07  Insurance payor: [employer]   Diet: Patient reports that he has struggled with anxiety and depression lately, finding comfort in food when stressed. Pt reports diet consists of mostly carbs with limited fruit and veggie intake. Goes out to eat frequently, usually ordering a burger or some sort of  sandwich. 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  Family History: No family history on file.  Social History: Former 1 PPD cigarette smoker, quit 2 years ago and rare alcohol use  Labs: No results found for: HGBA1C  Wt Readings from Last 1 Encounters:  06/02/22 (!) 335 lb (152 kg)    BP Readings from Last 1 Encounters:  06/02/22 123/85   Pulse Readings from Last 1 Encounters:  06/02/22 71    No results found for: CHOL, TRIG, HDL, CHOLHDL, VLDL, LDLCALC, LDLDIRECT  Past Medical History:  Diagnosis Date   Atrial fibrillation ([place])    Atrial flutter ([place])     Current Outpatient Medications on File Prior to Visit  Medication Sig Dispense Refill   Acetaminophen (TYLENOL PO) Take 3 tablets by mouth as needed. Extra strength Tylenol - 500mg  (Patient not taking: Reported on 05/09/2022)     cephALEXin (KEFLEX) 500 MG capsule Take 1 capsule (500 mg total) by mouth 4 (four) times daily. 40 capsule 0   dronedarone (MULTAQ) 400 MG tablet Take 1 tablet (400  mg total) by mouth 2 (two) times daily with a meal. 60 tablet 6   Multiple Vitamin (ONE-A-DAY MENS PO) Take 1 tablet by mouth in the morning.     rivaroxaban (XARELTO) 20 MG TABS tablet Take 20 mg by mouth daily with supper.     sertraline (ZOLOFT) 25 MG tablet Take 25 mg by mouth daily.     [DISCONTINUED] apixaban (ELIQUIS) 5 MG TABS tablet Take 1 tablet (5 mg total) by mouth 2 (two) times daily for 30 days. 60 tablet 0   No current facility-administered medications on file prior to visit.    No Known Allergies   Assessment/Plan:  1.  Weight loss - Patient is willing to start Wegovy in conjuntion with diet and exercise. Have submitted a PA for [facility] coverage. Plan to initiate Wegovy 0.25 mg once weekly after approval. Pt is agreeable to lifestyle modifications to diet and exercise. Confirmed patient has no personal or family history of medullary thyroid carcinoma (MTC) or Multiple Endocrine Neoplasia syndrome type 2 (MEN 2).   Advised patient on common side effects including nausea, diarrhea, dyspepsia, decreased appetite, and fatigue. Counseled patient on reducing meal size and how to titrate medication to minimize side effects. Counseled patient to call if intolerable side effects or if experiencing dehydration, abdominal pain, or dizziness. Patient will adhere to dietary modifications and will target at least 150 minutes of moderate intensity exercise weekly.   Injection technique reviewed at today's visit and patient.  Pt seen with Reatha Harps, PGY1 pharmacy resident  Hanna Supple, PharmD, BCACP, [place] [id number] [place], [place] [address] Phone: [phone]; Fax: [phone] 06/03/2022 2:57 PM

## 2022-06-03 NOTE — Telephone Encounter (Signed)
Russell [name] was denied as his insurance plan does not cover weight loss medications. Russell [name] may be an option depending on A1c results  being evaluated with PCP next Tuesday . Patient made aware and will follow up A1c results next week.

## 2022-06-03 NOTE — Patient Instructions (Signed)
[facility] Counseling Points This medication reduces your appetite and may make you feel fuller longer.  Stop eating when your body tells you that you are full. This will likely happen sooner than you are used to. Store your medication in the fridge until you are ready to use it. Inject your medication in the fatty tissue of your lower abdominal area (2 inches away from belly button) or upper outer thigh. Rotate injection sites. Common side effects include: nausea, diarrhea/constipation, and heartburn, and are more likely to occur if you overeat.   Tips for living a healthier life     Building a Healthy and Balanced Diet Make most of your meal vegetables and fruits -  of your plate. Aim for color and variety, and remember that potatoes don't count as vegetables on the Healthy Eating Plate because of their negative impact on blood sugar.  Go for whole grains -  of your plate. Whole and intact grains--whole wheat, barley, wheat berries, quinoa, oats, brown rice, and foods made with them, such as whole wheat pasta--have a milder effect on blood sugar and insulin than white bread, white rice, and other refined grains.  Protein power -  of your plate. Fish, poultry, beans, and nuts are all healthy, versatile protein sources--they can be mixed into salads, and pair well with vegetables on a plate. Limit red meat, and avoid processed meats such as bacon and sausage.  Healthy plant oils - in moderation. Choose healthy vegetable oils like olive, canola, soy, corn, sunflower, peanut, and others, and avoid partially hydrogenated oils, which contain unhealthy trans fats. Remember that low-fat does not mean "healthy."  Drink water, coffee, or tea. Skip sugary drinks, limit milk and dairy products to one to two servings per day, and limit juice to a small glass per day.  Stay active. The red figure running across the Healthy Eating Plate's placemat is a reminder that staying active is also important  in weight control.  The main message of the Healthy Eating Plate is to focus on diet quality:  The type of carbohydrate in the diet is more important than the amount of carbohydrate in the diet, because some sources of carbohydrate--like vegetables (other than potatoes), fruits, whole grains, and beans--are healthier than others. The Healthy Eating Plate also advises consumers to avoid sugary beverages, a major source of calories--usually with little nutritional value--in the American diet. The Healthy Eating Plate encourages consumers to use healthy oils, and it does not set a maximum on the percentage of calories people should get each day from healthy sources of fat. In this way, the Healthy Eating Plate recommends the opposite of the low-fat message promoted for decades by the USDA.  CueTune.com.eehttps://www.hsph.[employer].edu/nutritionsource/healthy-eating-plate/  SUGAR  Sugar is a huge problem in the modern day diet. Sugar is a big contributor to heart disease, diabetes, high triglyceride levels, fatty liver disease and obesity. Sugar is hidden in almost all packaged foods/beverages. Added sugar is extra sugar that is added beyond what is naturally found and has no nutritional benefit for your body. The American Heart Association recommends limiting added sugars to no more than 25g for women and 36 grams for men per day. There are many names for sugar including maltose, sucrose (names ending in "ose"), high fructose corn syrup, molasses, cane sugar, corn sweetener, raw sugar, syrup, honey or fruit juice concentrate.   One of the best ways to limit your added sugars is to stop drinking sweetened beverages such as soda, sweet tea, and fruit juice.  There is 65g of added sugars in one 20oz bottle of Coke! That is equal to 7.5 donuts.   Pay attention and read all nutrition facts labels. Below is an examples of a nutrition facts label. The #1 is showing you the total sugars where the # 2 is showing you the added  sugars. This one serving has almost the max amount of added sugars per day!     20 oz Soda 65g Sugar = 7.5 Glazed Donuts  16oz Energy  Drink 54g Sugar = 6.5 Glazed Donuts  Large Sweet  Tea 38g Sugar = 4 Glazed Donuts  20oz Sports  Drink 34g Sugar = 3.5 Glazed Donuts  8oz Chocolate Milk 24g Sugar =2.5 Glazed Donuts  8oz Orange  Juice 21g Sugar = 2 Glazed Donuts  1 Juice Box 14g Sugar = 1.5 Glazed Donuts  16oz Water= NO SUGAR!!  EXERCISE  Exercise is good. We've all heard that. In an ideal world, we would all have time and resources to get plenty of it. When you are active, your heart pumps more efficiently and you will feel better.  Multiple studies show that even walking regularly has benefits that include living a longer life. The American Heart Association recommends 150 minutes per week of exercise (30 minutes per day most days of the week). You can do this in any increment you wish. Nine or more 10-minute walks count. So does an hour-long exercise class. Break the time apart into what will work in your life. Some of the best things you can do include walking briskly, jogging, cycling or swimming laps. Not everyone is ready to "exercise." Sometimes we need to start with just getting active. Here are some easy ways to be more active throughout the day:  Take the stairs instead of the elevator  Go for a 10-15 minute walk during your lunch break (find a friend to make it more enjoyable)  When shopping, park at the back of the parking lot  If you take public transportation, get off one stop early and walk the extra distance  Pace around while making phone calls  Check with your doctor if you aren't sure what your limitations may be. Always remember to drink plenty of water when doing any type of exercise. Don't feel like a failure if you're not getting the 90-150 minutes per week. If you started by being a couch potato, then just a 10-minute walk each day is a huge  improvement. Start with little victories and work your way up.   HEALTHY EATING TIPS  When looking to improve your eating habits, whether to lose weight, lower blood pressure or just be healthier, it helps to know what a serving size is.   Grains 1 slice of bread,  bagel,  cup pasta or rice  Vegetables 1 cup fresh or raw vegetables,  cup cooked or canned Fruits 1 piece of medium sized fruit,  cup canned,   Meats/Proteins  cup dried       1 oz meat, 1 egg,  cup cooked beans, nuts or seeds  Dairy        Fats Individual yogurt container, 1 cup (8oz)    1 teaspoon margarine/butter or vegetable  milk or milk alternative, 1 slice of cheese          oil; 1 tablespoon mayonnaise or salad dressing                  Plan ahead: make a menu of the meals  for a week then create a grocery list to go with that menu. Consider meals that easily stretch into a night of leftovers, such as stews or casseroles. Or consider making two of your favorite meal and put one in the freezer for another night. Try a night or two each week that is "meatless" or "no cook" such as salads. When you get home from the grocery store wash and prepare your vegetables and fruits. Then when you need them they are ready to go.   Tips for going to the grocery store:  Buy store or generic brands  Check the weekly ad from your store on-line or in their in-store flyer  Look at the unit price on the shelf tag to compare/contrast the costs of different items  Buy fruits/vegetables in season  Carrots, bananas and apples are low-cost, naturally healthy items  If meats or frozen vegetables are on sale, buy some extras and put in your freezer  Limit buying prepared or "ready to eat" items, even if they are pre-made salads or fruit snacks  Do not shop when you're hungry  Foods at eye level tend to be more expensive. Look on the high and low shelves for deals.  Consider shopping at the farmer's market for fresh foods in season.  Avoid  the cookie and chip aisles (these are expensive, high in calories and low in nutritional value). Shop on the outside of the grocery store.  Healthy food preparations:  If you can't get lean hamburger, be sure to drain the fat when cooking  Steam, saut (in olive oil), grill or bake foods  Experiment with different seasonings to avoid adding salt to your foods. Kosher salt, sea salt and Himalayan salt are all still salt and should be avoided. Try seasoning food with onion, garlic, thyme, rosemary, basil ect. Onion powder or garlic powder is ok. Avoid if it says salt (ie garlic salt).

## 2022-06-11 ENCOUNTER — Telehealth: Payer: Self-pay | Admitting: Pharmacist

## 2022-06-11 DIAGNOSIS — Z833 Family history of diabetes mellitus: Secondary | ICD-10-CM | POA: Diagnosis not present

## 2022-06-11 DIAGNOSIS — Z6841 Body Mass Index (BMI) 40.0 and over, adult: Secondary | ICD-10-CM | POA: Diagnosis not present

## 2022-06-11 DIAGNOSIS — R7301 Impaired fasting glucose: Secondary | ICD-10-CM | POA: Diagnosis not present

## 2022-06-11 NOTE — Telephone Encounter (Signed)
Pt called to check on A1c results to determine whether or not Ozempic will eb covered on insurance. Pt reported A1c was normal. Unfortunately, pt does not qualify for a GLP-1 based on insurance plan. Pt encouraged to optimize diet and exercise and reach back out if insurance plan changes.

## 2022-06-26 DIAGNOSIS — R918 Other nonspecific abnormal finding of lung field: Secondary | ICD-10-CM | POA: Diagnosis not present

## 2022-06-26 DIAGNOSIS — G4733 Obstructive sleep apnea (adult) (pediatric): Secondary | ICD-10-CM | POA: Diagnosis not present

## 2022-06-26 DIAGNOSIS — I480 Paroxysmal atrial fibrillation: Secondary | ICD-10-CM | POA: Diagnosis not present

## 2022-06-26 DIAGNOSIS — F331 Major depressive disorder, recurrent, moderate: Secondary | ICD-10-CM | POA: Diagnosis not present

## 2022-07-01 ENCOUNTER — Other Ambulatory Visit (HOSPITAL_BASED_OUTPATIENT_CLINIC_OR_DEPARTMENT_OTHER): Payer: Self-pay

## 2022-08-02 ENCOUNTER — Other Ambulatory Visit (HOSPITAL_BASED_OUTPATIENT_CLINIC_OR_DEPARTMENT_OTHER): Payer: Self-pay

## 2022-08-11 ENCOUNTER — Emergency Department (HOSPITAL_COMMUNITY)
Admission: EM | Admit: 2022-08-11 | Discharge: 2022-08-11 | Disposition: A | Discharge status: Home / Self Care | Payer: BC Managed Care – PPO | Attending: Emergency Medicine | Admitting: Emergency Medicine

## 2022-08-11 ENCOUNTER — Other Ambulatory Visit: Payer: Self-pay

## 2022-08-11 DIAGNOSIS — Z7901 Long term (current) use of anticoagulants: Secondary | ICD-10-CM | POA: Diagnosis not present

## 2022-08-11 DIAGNOSIS — I83899 Varicose veins of unspecified lower extremities with other complications: Secondary | ICD-10-CM | POA: Diagnosis not present

## 2022-08-11 MED ORDER — LIDOCAINE-EPINEPHRINE (PF) 2 %-1:200000 IJ SOLN
10.0000 mL | Freq: Once | INTRAMUSCULAR | Status: AC
  Administered 2022-08-11: 10 mL via INTRADERMAL
  Filled 2022-08-11: qty 20

## 2022-08-11 NOTE — ED Notes (Signed)
Bleeding controlled with guaze and coban

## 2022-08-11 NOTE — ED Triage Notes (Signed)
Pt reports bleeding varicose vein ( to the right thigh) that started yesterday. Pt arrives with sock/compression wrap on leg. Varicose vein actively bleeding. Takes Xarelto.

## 2022-08-11 NOTE — ED Notes (Signed)
RN reviewed discharge instructions with pt. Pt verbalized understanding and had no further questions. VSS upon discharge.  

## 2022-08-11 NOTE — ED Provider Notes (Signed)
  [facility] Emergency Department Provider Note MRN:  [id number]  Arrival date & time: 08/11/22     Chief Complaint   Bleeding  History of Present Illness   Russell [name] is a 46 y.o. year-old male presents to the ED with chief complaint of bleeding from varicose vein.  Patient has history of Xarelto as well as severe varicose veins.  He thinks that he scratched his leg yesterday.  He is uncertain of the actual mechanism of injury.  He denies any other associated symptoms.  States that the varicose vein spray is blood when it is uncovered.Russell [name]  History provided by patient.   Review of Systems  Pertinent review of systems noted in HPI.    Physical Exam   Vitals:   08/11/22 0412  BP: 131/88  Pulse: 64  Resp: 16  Temp: 97.8 F (36.6 C)  SpO2: 100%    CONSTITUTIONAL:  well-appearing, NAD NEURO:  Alert and oriented x 3, CN 3-12 grossly intact EYES:  eyes equal and reactive ENT/NECK:  Supple, no stridor  CARDIO:   appears well-perfused  PULM:  No respiratory distress,  GI/GU:  non-distended,  MSK/SPINE:  No gross deformities, no edema, moves all extremities  SKIN:  no rash, atraumatic, many varicose veins on lower extremities, the affected vein is no longer bleeding on my exam after pressure bandage was applied.   *Additional and/or pertinent findings included in MDM below  Diagnostic and Interventional Summary    EKG Interpretation  Date/Time:    Ventricular Rate:    PR Interval:    QRS Duration:   QT Interval:    QTC Calculation:   R Axis:     Text Interpretation:         Labs Reviewed - No data to display  No orders to display    Medications  lidocaine-EPINEPHrine (XYLOCAINE W/EPI) 2 %-1:200000 (PF) injection 10 mL (has no administration in time range)     Procedures  /  Critical Care Procedures  ED Course and Medical Decision Making  I have reviewed the triage vital signs, the nursing notes, and pertinent available records  from the EMR.  Social Determinants Affecting Complexity of Care: Patient has no clinically significant social determinants affecting this chief complaint..   ED Course:    Medical Decision Making Patient here with bleeding from a varicose vein.  Bleeding was controlled in triage with a pressure bandage.  No longer bleeding now with the bandage removed.  I applied some quick clot and put on a new bandage.  Wound care precautions given.  Patient stable for discharge.  Risk Prescription drug management.     Consultants: No consultations were needed in caring for this patient.   Treatment and Plan: Emergency department workup does not suggest an emergent condition requiring admission or immediate intervention beyond  what has been performed at this time. The patient is safe for discharge and has  been instructed to return immediately for worsening symptoms, change in  symptoms or any other concerns    Final Clinical Impressions(s) / ED Diagnoses     ICD-10-CM   1. Bleeding from varicose vein  I83.899       ED Discharge Orders     None         Discharge Instructions Discussed with and Provided to Patient:   Discharge Instructions   None      Roxy Horseman, PA-C 08/11/22 0502    Sloan Leiter, DO 08/13/22 [phone]

## 2022-08-13 DIAGNOSIS — F331 Major depressive disorder, recurrent, moderate: Secondary | ICD-10-CM | POA: Diagnosis not present

## 2022-08-13 DIAGNOSIS — Z6841 Body Mass Index (BMI) 40.0 and over, adult: Secondary | ICD-10-CM | POA: Diagnosis not present

## 2022-08-13 DIAGNOSIS — Z7182 Exercise counseling: Secondary | ICD-10-CM | POA: Diagnosis not present

## 2022-08-13 DIAGNOSIS — Z713 Dietary counseling and surveillance: Secondary | ICD-10-CM | POA: Diagnosis not present

## 2022-08-15 DIAGNOSIS — R600 Localized edema: Secondary | ICD-10-CM | POA: Diagnosis not present

## 2022-08-15 DIAGNOSIS — Z79899 Other long term (current) drug therapy: Secondary | ICD-10-CM | POA: Diagnosis not present

## 2022-08-15 DIAGNOSIS — I82811 Embolism and thrombosis of superficial veins of right lower extremities: Secondary | ICD-10-CM | POA: Diagnosis not present

## 2022-08-15 DIAGNOSIS — I4891 Unspecified atrial fibrillation: Secondary | ICD-10-CM | POA: Diagnosis not present

## 2022-08-15 DIAGNOSIS — I4892 Unspecified atrial flutter: Secondary | ICD-10-CM | POA: Diagnosis not present

## 2022-08-15 DIAGNOSIS — Z87891 Personal history of nicotine dependence: Secondary | ICD-10-CM | POA: Diagnosis not present

## 2022-08-15 DIAGNOSIS — M79661 Pain in right lower leg: Secondary | ICD-10-CM | POA: Diagnosis not present

## 2022-08-15 DIAGNOSIS — Z7901 Long term (current) use of anticoagulants: Secondary | ICD-10-CM | POA: Diagnosis not present

## 2022-08-15 DIAGNOSIS — Z86718 Personal history of other venous thrombosis and embolism: Secondary | ICD-10-CM | POA: Diagnosis not present

## 2022-08-15 DIAGNOSIS — M7989 Other specified soft tissue disorders: Secondary | ICD-10-CM | POA: Diagnosis not present

## 2022-08-20 DIAGNOSIS — Z6841 Body Mass Index (BMI) 40.0 and over, adult: Secondary | ICD-10-CM | POA: Diagnosis not present

## 2022-08-20 DIAGNOSIS — Z713 Dietary counseling and surveillance: Secondary | ICD-10-CM | POA: Diagnosis not present

## 2022-08-22 DIAGNOSIS — F331 Major depressive disorder, recurrent, moderate: Secondary | ICD-10-CM | POA: Diagnosis not present

## 2022-08-26 DIAGNOSIS — Z5941 Food insecurity: Secondary | ICD-10-CM | POA: Diagnosis not present

## 2022-08-26 DIAGNOSIS — Z713 Dietary counseling and surveillance: Secondary | ICD-10-CM | POA: Diagnosis not present

## 2022-08-26 DIAGNOSIS — K760 Fatty (change of) liver, not elsewhere classified: Secondary | ICD-10-CM | POA: Diagnosis not present

## 2022-09-05 ENCOUNTER — Other Ambulatory Visit (HOSPITAL_BASED_OUTPATIENT_CLINIC_OR_DEPARTMENT_OTHER): Payer: Self-pay

## 2022-10-01 ENCOUNTER — Ambulatory Visit (HOSPITAL_COMMUNITY): Payer: BC Managed Care – PPO | Admitting: Physician Assistant

## 2022-10-07 ENCOUNTER — Other Ambulatory Visit (HOSPITAL_BASED_OUTPATIENT_CLINIC_OR_DEPARTMENT_OTHER): Payer: Self-pay

## 2022-10-10 DIAGNOSIS — R911 Solitary pulmonary nodule: Secondary | ICD-10-CM | POA: Diagnosis not present

## 2022-10-10 DIAGNOSIS — M62838 Other muscle spasm: Secondary | ICD-10-CM | POA: Diagnosis not present

## 2022-10-10 DIAGNOSIS — I4891 Unspecified atrial fibrillation: Secondary | ICD-10-CM | POA: Diagnosis not present

## 2022-10-10 DIAGNOSIS — I311 Chronic constrictive pericarditis: Secondary | ICD-10-CM | POA: Diagnosis not present

## 2022-10-10 DIAGNOSIS — R0781 Pleurodynia: Secondary | ICD-10-CM | POA: Diagnosis not present

## 2022-10-10 DIAGNOSIS — Z87891 Personal history of nicotine dependence: Secondary | ICD-10-CM | POA: Diagnosis not present

## 2022-10-10 DIAGNOSIS — M62830 Muscle spasm of back: Secondary | ICD-10-CM | POA: Diagnosis not present

## 2022-10-10 DIAGNOSIS — R109 Unspecified abdominal pain: Secondary | ICD-10-CM | POA: Diagnosis not present

## 2022-10-10 DIAGNOSIS — M791 Myalgia, unspecified site: Secondary | ICD-10-CM | POA: Diagnosis not present

## 2022-10-10 DIAGNOSIS — Z79899 Other long term (current) drug therapy: Secondary | ICD-10-CM | POA: Diagnosis not present

## 2022-10-10 DIAGNOSIS — M549 Dorsalgia, unspecified: Secondary | ICD-10-CM | POA: Diagnosis not present

## 2022-11-08 ENCOUNTER — Other Ambulatory Visit (HOSPITAL_BASED_OUTPATIENT_CLINIC_OR_DEPARTMENT_OTHER): Payer: Self-pay

## 2022-11-08 ENCOUNTER — Other Ambulatory Visit: Payer: Self-pay | Admitting: Physician Assistant

## 2022-11-08 MED ORDER — MULTAQ 400 MG PO TABS
400.0000 mg | ORAL_TABLET | Freq: Two times a day (BID) | ORAL | 0 refills | Status: DC
  Filled 2022-11-08: qty 60, 30d supply, fill #0

## 2022-11-22 ENCOUNTER — Other Ambulatory Visit (HOSPITAL_BASED_OUTPATIENT_CLINIC_OR_DEPARTMENT_OTHER): Payer: Self-pay

## 2022-11-22 ENCOUNTER — Encounter: Payer: Self-pay | Admitting: Internal Medicine

## 2022-12-17 ENCOUNTER — Other Ambulatory Visit (HOSPITAL_BASED_OUTPATIENT_CLINIC_OR_DEPARTMENT_OTHER): Payer: Self-pay

## 2022-12-17 ENCOUNTER — Other Ambulatory Visit: Payer: Self-pay | Admitting: Physician Assistant

## 2022-12-17 MED ORDER — MULTAQ 400 MG PO TABS
400.0000 mg | ORAL_TABLET | Freq: Two times a day (BID) | ORAL | 0 refills | Status: DC
  Filled 2022-12-17: qty 60, 30d supply, fill #0

## 2023-01-21 ENCOUNTER — Other Ambulatory Visit: Payer: Self-pay | Admitting: Physician Assistant

## 2023-01-21 ENCOUNTER — Other Ambulatory Visit (HOSPITAL_COMMUNITY): Payer: Self-pay

## 2023-01-21 MED ORDER — MULTAQ 400 MG PO TABS
400.0000 mg | ORAL_TABLET | Freq: Two times a day (BID) | ORAL | 0 refills | Status: DC
  Filled 2023-01-21 – 2023-01-22 (×2): qty 60, 30d supply, fill #0

## 2023-01-22 ENCOUNTER — Other Ambulatory Visit (HOSPITAL_BASED_OUTPATIENT_CLINIC_OR_DEPARTMENT_OTHER): Payer: Self-pay

## 2023-01-22 ENCOUNTER — Other Ambulatory Visit (HOSPITAL_COMMUNITY): Payer: Self-pay

## 2023-01-23 ENCOUNTER — Other Ambulatory Visit (HOSPITAL_BASED_OUTPATIENT_CLINIC_OR_DEPARTMENT_OTHER): Payer: Self-pay

## 2023-02-02 DIAGNOSIS — R0789 Other chest pain: Secondary | ICD-10-CM | POA: Diagnosis not present

## 2023-02-02 DIAGNOSIS — R079 Chest pain, unspecified: Secondary | ICD-10-CM | POA: Diagnosis not present

## 2023-02-03 DIAGNOSIS — I491 Atrial premature depolarization: Secondary | ICD-10-CM | POA: Diagnosis not present

## 2023-02-04 DIAGNOSIS — R079 Chest pain, unspecified: Secondary | ICD-10-CM | POA: Diagnosis not present

## 2023-02-04 DIAGNOSIS — I491 Atrial premature depolarization: Secondary | ICD-10-CM | POA: Diagnosis not present

## 2023-02-04 DIAGNOSIS — Z20822 Contact with and (suspected) exposure to covid-19: Secondary | ICD-10-CM | POA: Diagnosis not present

## 2023-02-04 DIAGNOSIS — I440 Atrioventricular block, first degree: Secondary | ICD-10-CM | POA: Diagnosis not present

## 2023-02-04 DIAGNOSIS — R0789 Other chest pain: Secondary | ICD-10-CM | POA: Diagnosis not present

## 2023-02-17 ENCOUNTER — Other Ambulatory Visit (HOSPITAL_COMMUNITY): Payer: Self-pay

## 2023-02-28 ENCOUNTER — Other Ambulatory Visit (HOSPITAL_BASED_OUTPATIENT_CLINIC_OR_DEPARTMENT_OTHER): Payer: Self-pay

## 2023-02-28 ENCOUNTER — Other Ambulatory Visit: Payer: Self-pay | Admitting: Physician Assistant

## 2023-02-28 MED ORDER — MULTAQ 400 MG PO TABS
400.0000 mg | ORAL_TABLET | Freq: Two times a day (BID) | ORAL | 0 refills | Status: DC
  Filled 2023-02-28: qty 60, 30d supply, fill #0

## 2023-03-24 ENCOUNTER — Other Ambulatory Visit (HOSPITAL_BASED_OUTPATIENT_CLINIC_OR_DEPARTMENT_OTHER): Payer: Self-pay

## 2023-03-26 ENCOUNTER — Other Ambulatory Visit (HOSPITAL_BASED_OUTPATIENT_CLINIC_OR_DEPARTMENT_OTHER): Payer: Self-pay

## 2023-03-27 ENCOUNTER — Other Ambulatory Visit (HOSPITAL_BASED_OUTPATIENT_CLINIC_OR_DEPARTMENT_OTHER): Payer: Self-pay

## 2023-03-27 ENCOUNTER — Other Ambulatory Visit: Payer: Self-pay | Admitting: Physician Assistant

## 2023-03-27 ENCOUNTER — Other Ambulatory Visit: Payer: Self-pay

## 2023-03-27 MED ORDER — SERTRALINE HCL 25 MG PO TABS
25.0000 mg | ORAL_TABLET | Freq: Every day | ORAL | 1 refills | Status: DC
  Filled 2023-03-27: qty 90, 90d supply, fill #0

## 2023-03-27 MED ORDER — MULTAQ 400 MG PO TABS
400.0000 mg | ORAL_TABLET | Freq: Two times a day (BID) | ORAL | 0 refills | Status: DC
  Filled 2023-03-27: qty 60, 30d supply, fill #0

## 2023-03-27 MED ORDER — RIVAROXABAN 20 MG PO TABS
20.0000 mg | ORAL_TABLET | Freq: Every day | ORAL | 2 refills | Status: DC
  Filled 2023-03-27: qty 30, 30d supply, fill #0
  Filled 2023-04-25: qty 30, 30d supply, fill #1

## 2023-03-28 ENCOUNTER — Other Ambulatory Visit (HOSPITAL_BASED_OUTPATIENT_CLINIC_OR_DEPARTMENT_OTHER): Payer: Self-pay

## 2023-04-29 ENCOUNTER — Other Ambulatory Visit (HOSPITAL_BASED_OUTPATIENT_CLINIC_OR_DEPARTMENT_OTHER): Payer: Self-pay

## 2023-04-29 ENCOUNTER — Other Ambulatory Visit: Payer: Self-pay | Admitting: Physician Assistant

## 2023-04-29 MED ORDER — MULTAQ 400 MG PO TABS
400.0000 mg | ORAL_TABLET | Freq: Two times a day (BID) | ORAL | 0 refills | Status: DC
  Filled 2023-04-29: qty 30, 15d supply, fill #0

## 2023-05-06 ENCOUNTER — Other Ambulatory Visit (HOSPITAL_BASED_OUTPATIENT_CLINIC_OR_DEPARTMENT_OTHER): Payer: Self-pay

## 2023-05-19 NOTE — Progress Notes (Unsigned)
Cardiology Office Note:    Date:  05/20/2023   ID:  Russell [name], DOB 09-12-1976, MRN [id number]  PCP:  Russell Finders, PA-C   [facility] Providers Cardiologist:  Russell Skeans, MD Referring MD: Russell Finders, PA-C   Chief Complaint/Reason for Referral: Establish general cardiovascular care  ASSESSMENT:    1. Pericardial calcification   2. Paroxysmal atrial fibrillation (HCC)   3. BMI 45[name]0-49[name]9, adult (HCC)   4. Deep vein thrombosis (DVT) of proximal lower extremity, unspecified chronicity, unspecified laterality (HCC)      PLAN:    In order of problems listed above: 1.  Pericardial calcification: No evidence of constriction on echocardiogram from last year.  Continue to monitor.   2.  Atrial fibrillation: Being followed by atrial fibrillation clinic.  The patient has a low CHA2DS2-VASc score so indefinite anticoagulation is not required but the patient is on Xarelto due to unprovoked DVT.  Continue Multaq.  I have asked the patient to reach out to the atrial fibrillation clinic's to ensure that he has continuity of care. 3.  Elevated BMI: Did not qualify for a GLP1 receptor agonist due to insurance coverage. 4.  DVT: DVT in April 2023 which was seemingly unprovoked.  Remains on Xarelto.   Dispo:  Return in about 18 months (around 11/19/2024).      Medication Adjustments/Labs and Tests Ordered: Current medicines are reviewed at length with the patient today.  Concerns regarding medicines are outlined above.  The following changes have been made:  no change   Labs/tests ordered: Orders Placed This Encounter  Procedures   EKG 12-Lead    Medication Changes: No orders of the defined types were placed in this encounter.    Current medicines are reviewed at length with the patient today.  The patient does not have concerns regarding medicines.   History of Present Illness:    FOCUSED PROBLEM LIST:   1.  Atrial flutter status post ablation 2021 2.   Atrial fibrillation with CHA2DS2-VASc score of 0 followed by atrial fibrillation clinic 3.  BMI of 48 4.  DVT April 2023, unprovoked, on Xarelto 5.  OSA, not yet on CPAP  May 2023: The patient is a Russell [name]. male with the indicated medical history here to establish general cardiology care.  The patient was seen by his primary care provider recently.  He had developed a DVT and was seen in the emergency department at an outside facility.  He had no inciting factors.  He was started on Xarelto.  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  He is tolerating Xarelto well.  He denies any significant shortness of breath, chest pain, palpitations, paroxysmal nocturnal dyspnea, orthopnea.  He is working to get fitted for CPAP though he cannot afford the co-pay right now.  He is required no recent emergency room visits or hospitalizations since being in the hospital for his DVT.  He is otherwise well without complaints.  Plan: Obtain echocardiogram; refer to pharmacy given elevated BMI.  Today: In the interim the patient was seen by pharmacy and started on Wegovy.  Unfortunately his insurance would not cover this and so this was stopped.  An echocardiogram was done which demonstrated a thickened and calcified pericardium with no evidence of tamponade.  His ejection fraction was preserved.  He had no significant valvular abnormalities.  He was seen in August of last year due to bleeding controlled with gauze, Coban, and lidocaine with epinephrine injection.  He was seen by his PCP and they elected to pursue indefinite anticoagulation due to his unprovoked DVT.  The patient is otherwise doing well.  He denies any shortness of breath, presyncope, syncope, paroxysmal nocturnal dyspnea, or palpitations.  He does  have occasional dependent edema.  He fortunately has not required hospitalization.  Current Medications: Current Meds  Medication Sig   Acetaminophen (TYLENOL PO) Take 3 tablets by mouth as needed. Extra strength Tylenol - 500mg    dronedarone (MULTAQ) 400 MG tablet Take 1 tablet (400 mg total) by mouth 2 (two) times daily with a meal. Appointment Required For Further Refills [phone]   Multiple Vitamin (ONE-A-DAY MENS PO) Take 1 tablet by mouth in the morning.   rivaroxaban (XARELTO) 20 MG TABS tablet Take 1 tablet (20 mg total) by mouth daily.   sertraline (ZOLOFT) 25 MG tablet Take 25 mg by mouth daily.   [DISCONTINUED] rivaroxaban (XARELTO) 20 MG TABS tablet Take 20 mg by mouth daily with supper.     Allergies:    Patient has no known allergies.   Social History:   Social History   Tobacco Use   Smoking status: Former    Packs/day: 0    Types: Cigarettes   Smokeless tobacco: Never   Tobacco comments:    Former smoker 10/24/2021  Vaping Use   Vaping Use: Never used  Substance Use Topics   Alcohol use: Not Currently   Drug use: Yes    Types: Marijuana    Comment: "3-4x per week"     Family Hx: History reviewed. No pertinent family history.   Review of Systems:   Please see the history of present illness.    All other systems reviewed and are negative.     EKGs/Labs/Other Test Reviewed:    EKG:  EKG performed April 2023 that I personally reviewed demonstrates sinus rhythm; EKG done today demonstrates sinus bradycardia with sinus arrhythmia  Prior CV studies:  TTE 22-Jun-2022:  1. Left ventricular ejection fraction, by estimation, is 50 to 55%. The  left ventricle has low normal function. The left ventricle has no regional  wall motion abnormalities. Left ventricular diastolic parameters were  normal.   2. Right ventricular systolic function is normal. The right ventricular  size is normal.   3. There appears to be thickened / calcified pericardium in various  areas  of the pericardium.   4. The mitral valve is normal in structure. No evidence of mitral valve  regurgitation.   5. The aortic valve is normal in structure. Aortic valve regurgitation is  not visualized.   Other studies Reviewed: Review of the additional studies/records demonstrates: CT 2020-06-22 with pericardial calcification  Recent Labs: No results found for requested labs within last 365 days.   Recent Lipid Panel No results found for: "CHOL", "TRIG", "HDL", "LDLCALC", "LDLDIRECT"  Risk Assessment/Calculations:     CHA2DS2-VASc Score =     This indicates a  % annual risk of stroke. The patient's score is based upon:            Physical Exam:    VS:  BP 116/86   Pulse 61   Ht 5' 8[name]5" (1[name]74 m)   Wt (!) 324 lb (147 kg)   SpO2 99%   BMI 48[name]55 kg/m    Wt Readings from Last 3 Encounters:  05/20/23 Russell Kitchen)  324 lb (147 kg)  06/02/22 (!) 335 lb (152 kg)  05/09/22 (!) 334 lb 3[name]2 oz (151[name]6 kg)    GENERAL:  No apparent distress, AOx3 HEENT:  No carotid bruits, +2 carotid impulses, no scleral icterus CAR: RRR no murmurs, gallops, rubs, or thrills RES:  Clear to auscultation bilaterally ABD:  Soft, nontender, nondistended, positive bowel sounds x 4 VASC:  +2 radial pulses, +2 carotid pulses, palpable pedal pulses NEURO:  CN 2-12 grossly intact; motor and sensory grossly intact PSYCH:  No active depression or anxiety EXT:  No edema, ecchymosis, or cyanosis  Signed, [name], MD  05/20/2023 2:16 PM    [facility] [address] [place], [place], [place]  [address] Phone: [phone]; Fax: [phone]   Note:  This document was prepared using Dragon voice recognition software and may include unintentional dictation errors.

## 2023-05-20 ENCOUNTER — Ambulatory Visit: Payer: BC Managed Care – PPO | Attending: Internal Medicine | Admitting: Internal Medicine

## 2023-05-20 ENCOUNTER — Other Ambulatory Visit: Payer: Self-pay | Admitting: Physician Assistant

## 2023-05-20 ENCOUNTER — Encounter: Payer: Self-pay | Admitting: Internal Medicine

## 2023-05-20 VITALS — BP 116/86 | HR 61 | Ht 68.5 in | Wt 324.0 lb

## 2023-05-20 DIAGNOSIS — I318 Other specified diseases of pericardium: Secondary | ICD-10-CM

## 2023-05-20 DIAGNOSIS — Z6841 Body Mass Index (BMI) 40.0 and over, adult: Secondary | ICD-10-CM | POA: Diagnosis not present

## 2023-05-20 DIAGNOSIS — I824Y9 Acute embolism and thrombosis of unspecified deep veins of unspecified proximal lower extremity: Secondary | ICD-10-CM

## 2023-05-20 DIAGNOSIS — I480 Paroxysmal atrial fibrillation: Secondary | ICD-10-CM

## 2023-05-20 NOTE — Patient Instructions (Signed)
Medication Instructions:  Your physician recommends that you continue on your current medications as directed. Please refer to the Current Medication list given to you today.  *If you need a refill on your cardiac medications before your next appointment, please call your pharmacy*  Follow-Up: At [facility], you and your health needs are our priority.  As part of our continuing mission to provide you with exceptional heart care, we have created designated Provider Care Teams.  These Care Teams include your primary Cardiologist (physician) and Advanced Practice Providers (APPs -  Physician Assistants and Nurse Practitioners) who all work together to provide you with the care you need, when you need it.  Your next appointment:   1.5 year(s)  Provider:   [name], MD

## 2023-05-21 ENCOUNTER — Other Ambulatory Visit (HOSPITAL_BASED_OUTPATIENT_CLINIC_OR_DEPARTMENT_OTHER): Payer: Self-pay

## 2023-05-21 MED ORDER — MULTAQ 400 MG PO TABS
400.0000 mg | ORAL_TABLET | Freq: Two times a day (BID) | ORAL | 0 refills | Status: DC
  Filled 2023-05-21: qty 30, 15d supply, fill #0

## 2023-05-23 ENCOUNTER — Other Ambulatory Visit (HOSPITAL_BASED_OUTPATIENT_CLINIC_OR_DEPARTMENT_OTHER): Payer: Self-pay

## 2023-05-28 ENCOUNTER — Other Ambulatory Visit (HOSPITAL_BASED_OUTPATIENT_CLINIC_OR_DEPARTMENT_OTHER): Payer: Self-pay

## 2023-05-28 DIAGNOSIS — I480 Paroxysmal atrial fibrillation: Secondary | ICD-10-CM | POA: Diagnosis not present

## 2023-05-28 DIAGNOSIS — F331 Major depressive disorder, recurrent, moderate: Secondary | ICD-10-CM | POA: Diagnosis not present

## 2023-05-28 DIAGNOSIS — Z86718 Personal history of other venous thrombosis and embolism: Secondary | ICD-10-CM | POA: Diagnosis not present

## 2023-05-28 MED ORDER — XARELTO 20 MG PO TABS
20.0000 mg | ORAL_TABLET | Freq: Every day | ORAL | 0 refills | Status: DC
  Filled 2023-05-28 – 2023-06-21 (×2): qty 30, 30d supply, fill #0

## 2023-05-28 MED ORDER — XARELTO 20 MG PO TABS
20.0000 mg | ORAL_TABLET | Freq: Every day | ORAL | 2 refills | Status: DC
  Filled 2023-05-28 – 2023-05-29 (×3): qty 30, 30d supply, fill #0
  Filled 2023-09-25: qty 30, 30d supply, fill #1
  Filled 2023-11-24: qty 30, 30d supply, fill #2

## 2023-05-28 MED ORDER — SERTRALINE HCL 25 MG PO TABS
25.0000 mg | ORAL_TABLET | Freq: Every day | ORAL | 1 refills | Status: DC
  Filled 2023-05-28 – 2023-06-21 (×2): qty 90, 90d supply, fill #0
  Filled 2023-09-22: qty 90, 90d supply, fill #1

## 2023-05-29 ENCOUNTER — Other Ambulatory Visit: Payer: Self-pay

## 2023-05-29 ENCOUNTER — Other Ambulatory Visit (HOSPITAL_BASED_OUTPATIENT_CLINIC_OR_DEPARTMENT_OTHER): Payer: Self-pay

## 2023-06-02 ENCOUNTER — Other Ambulatory Visit: Payer: Self-pay | Admitting: Physician Assistant

## 2023-06-03 ENCOUNTER — Other Ambulatory Visit (HOSPITAL_BASED_OUTPATIENT_CLINIC_OR_DEPARTMENT_OTHER): Payer: Self-pay

## 2023-06-06 ENCOUNTER — Other Ambulatory Visit (HOSPITAL_COMMUNITY): Payer: Self-pay | Admitting: *Deleted

## 2023-06-06 ENCOUNTER — Other Ambulatory Visit (HOSPITAL_BASED_OUTPATIENT_CLINIC_OR_DEPARTMENT_OTHER): Payer: Self-pay

## 2023-06-06 MED ORDER — MULTAQ 400 MG PO TABS
400.0000 mg | ORAL_TABLET | Freq: Two times a day (BID) | ORAL | 1 refills | Status: DC
  Filled 2023-06-06: qty 60, 30d supply, fill #0

## 2023-06-16 DIAGNOSIS — M79604 Pain in right leg: Secondary | ICD-10-CM | POA: Diagnosis not present

## 2023-06-16 DIAGNOSIS — Z86718 Personal history of other venous thrombosis and embolism: Secondary | ICD-10-CM | POA: Diagnosis not present

## 2023-06-17 ENCOUNTER — Other Ambulatory Visit (HOSPITAL_COMMUNITY): Payer: Self-pay | Admitting: *Deleted

## 2023-06-17 ENCOUNTER — Other Ambulatory Visit (HOSPITAL_BASED_OUTPATIENT_CLINIC_OR_DEPARTMENT_OTHER): Payer: Self-pay

## 2023-06-17 MED ORDER — MULTAQ 400 MG PO TABS
400.0000 mg | ORAL_TABLET | Freq: Two times a day (BID) | ORAL | 5 refills | Status: DC
Start: 2023-06-17 — End: 2024-01-05
  Filled 2023-06-17 – 2023-07-07 (×2): qty 60, 30d supply, fill #0
  Filled 2023-08-04: qty 60, 30d supply, fill #1
  Filled 2023-09-04: qty 60, 30d supply, fill #2
  Filled 2023-10-04: qty 60, 30d supply, fill #3
  Filled 2023-11-06: qty 60, 30d supply, fill #4
  Filled 2023-12-02: qty 60, 30d supply, fill #5

## 2023-06-21 ENCOUNTER — Other Ambulatory Visit (HOSPITAL_BASED_OUTPATIENT_CLINIC_OR_DEPARTMENT_OTHER): Payer: Self-pay

## 2023-06-23 ENCOUNTER — Other Ambulatory Visit (HOSPITAL_BASED_OUTPATIENT_CLINIC_OR_DEPARTMENT_OTHER): Payer: Self-pay

## 2023-06-25 ENCOUNTER — Other Ambulatory Visit (HOSPITAL_BASED_OUTPATIENT_CLINIC_OR_DEPARTMENT_OTHER): Payer: Self-pay

## 2023-06-25 ENCOUNTER — Other Ambulatory Visit: Payer: Self-pay

## 2023-06-25 ENCOUNTER — Encounter (HOSPITAL_BASED_OUTPATIENT_CLINIC_OR_DEPARTMENT_OTHER): Payer: Self-pay

## 2023-06-25 ENCOUNTER — Emergency Department (HOSPITAL_BASED_OUTPATIENT_CLINIC_OR_DEPARTMENT_OTHER)
Admission: EM | Admit: 2023-06-25 | Discharge: 2023-06-25 | Disposition: A | Discharge status: Home / Self Care | Payer: BC Managed Care – PPO | Attending: Emergency Medicine | Admitting: Emergency Medicine

## 2023-06-25 DIAGNOSIS — M79662 Pain in left lower leg: Secondary | ICD-10-CM | POA: Diagnosis not present

## 2023-06-25 DIAGNOSIS — Z7901 Long term (current) use of anticoagulants: Secondary | ICD-10-CM | POA: Insufficient documentation

## 2023-06-25 DIAGNOSIS — Z87891 Personal history of nicotine dependence: Secondary | ICD-10-CM | POA: Diagnosis not present

## 2023-06-25 DIAGNOSIS — L03116 Cellulitis of left lower limb: Secondary | ICD-10-CM | POA: Insufficient documentation

## 2023-06-25 HISTORY — DX: Cellulitis, unspecified: L03.90

## 2023-06-25 MED ORDER — CEPHALEXIN 500 MG PO CAPS
500.0000 mg | ORAL_CAPSULE | Freq: Four times a day (QID) | ORAL | 0 refills | Status: DC
  Filled 2023-06-25: qty 24, 6d supply, fill #0

## 2023-06-25 NOTE — Discharge Instructions (Signed)
As discussed, your symptoms are consistent with cellulitis.  Will treat this with oral antibiotics.  Recommend keeping track of redness to see if it extends or worsens.  Return if you develop persistent fevers/chills or other signs concerning for systemic illness as we discussed.  Recommend follow-up with primary care for reassessment of symptoms within 2 to 3 days.  Please not hesitate to return to emergency department if the worrisome signs and symptoms we discussed become apparent.

## 2023-06-25 NOTE — ED Provider Notes (Signed)
[place] [facility] Provider Note   CSN: [id number] Arrival date & time: 06/25/23  1524     History  Chief Complaint  Patient presents with   Leg Pain    Russell [name] is a 47 y.o. male.   Leg Pain   47 year old male presents emergency department with complaints of left lower extremity redness and pain.  Patient reports symptoms beginning 2 days ago and has noticed slightly progressing of symptoms since then.  Reports history of cellulitis in affected leg.  States that he had some "cracked skin" that had scabbed prior to symptom onset.  Reports some subjective chills yesterday when he was driving home from work but seem to resolved when he got home and has been without fever/chills otherwise.  Denies any chest pain, shortness of breath, abdominal pain, nausea, vomiting, urinary symptoms, change in bowel habits.  Patient reports baseline lower extremity edema and states that has remained unchanged.  Denies any weakness or sensory deficits in the legs bilaterally.  Past medical history significant for atrial fibrillation on Xarelto, cellulitis, obesity  Home Medications Prior to Admission medications   Medication Sig Start Date End Date Taking? Authorizing Provider  cephALEXin (KEFLEX) 500 MG capsule Take 1 capsule (500 mg total) by mouth 4 (four) times daily. 06/25/23  Yes Sherian Maroon A, PA  Acetaminophen (TYLENOL PO) Take 3 tablets by mouth as needed. Extra strength Tylenol - 500mg     [provider]  dronedarone (MULTAQ) 400 MG tablet Take 1 tablet (400 mg total) by mouth 2 (two) times daily with a meal. 06/17/23   Fenton, Clint R, PA  Multiple Vitamin (ONE-A-DAY MENS PO) Take 1 tablet by mouth in the morning.    [provider]  rivaroxaban (XARELTO) 20 MG TABS tablet Take 1 tablet (20 mg total) by mouth daily. 05/28/23     rivaroxaban (XARELTO) 20 MG TABS tablet Take 1 tablet (20 mg total) by mouth daily. 05/28/23      sertraline (ZOLOFT) 25 MG tablet Take 25 mg by mouth daily. 11/13/21   [provider]  sertraline (ZOLOFT) 25 MG tablet Take 1 tablet (25 mg total) by mouth daily. 05/28/23     apixaban (ELIQUIS) 5 MG TABS tablet Take 1 tablet (5 mg total) by mouth 2 (two) times daily for 30 days. 06/18/19 03/02/20  Gerhard Munch, MD      Allergies    Patient has no known allergies.    Review of Systems   Review of Systems  All other systems reviewed and are negative.   Physical Exam Updated Vital Signs BP 111/69 (BP Location: Right Arm)   Pulse 74   Temp 98.8 F (37.1 C)   Resp 18   Ht 5\' 8"  (1.727 m)   Wt (!) 137.4 kg   SpO2 96%   BMI 46.07 kg/m  Physical Exam Vitals and nursing note reviewed.  Constitutional:      General: He is not in acute distress.    Appearance: He is well-developed.  HENT:     Head: Normocephalic and atraumatic.  Eyes:     Conjunctiva/sclera: Conjunctivae normal.  Cardiovascular:     Rate and Rhythm: Normal rate. Rhythm irregular.     Heart sounds: No murmur heard. Pulmonary:     Effort: Pulmonary effort is normal. No respiratory distress.     Breath sounds: Normal breath sounds.  Abdominal:     Palpations: Abdomen is soft.     Tenderness: There is  no abdominal tenderness.  Musculoskeletal:        General: No swelling.     Cervical back: Neck supple.     Comments: Patient full range of motion of bilateral hips, knees, ankles, digits.  Erythematous skin from knee to ankle on the left side on anterior aspect but not on posterior aspect of leg.  Tender to palpation.  Patient with 1-2+ bilateral lower extremity.  Pedal pulses 2+ bilaterally.  No obvious palpable cord/mass.  Skin:    General: Skin is warm and dry.     Capillary Refill: Capillary refill takes less than 2 seconds.  Neurological:     Mental Status: He is alert.  Psychiatric:        Mood and Affect: Mood normal.     ED Results / Procedures / Treatments   Labs (all labs ordered are  listed, but only abnormal results are displayed) Labs Reviewed - No data to display  EKG None  Radiology No results found.  Procedures Procedures    Medications Ordered in ED Medications - No data to display  ED Course/ Medical Decision Making/ A&P                             Medical Decision Making Risk Prescription drug management.   This patient presents to the ED for concern of leg pain/redness, this involves an extensive number of treatment options, and is a complaint that carries with it a high risk of complications and morbidity.  The differential diagnosis includes cellulitis, erysipelas, necrotizing fasciitis, abscess, fracture, dislocation, sepsis, compartment syndrome, DVT/PAD   Co morbidities that complicate the patient evaluation  See HPI   Additional history obtained:  Additional history obtained from EMR External records from outside source obtained and reviewed including hospital records   Lab Tests:  N/a   Imaging Studies ordered:  N/a   Cardiac Monitoring: / EKG:  The patient was maintained on a cardiac monitor.  I personally viewed and interpreted the cardiac monitored which showed an underlying rhythm of: Regular rate irregular rhythm   Consultations Obtained:  N/a   Problem List / ED Course / Critical interventions / Medication management  Cellulitis Reevaluation of the patient showed that the patient stayed the same I have reviewed the patients home medicines and have made adjustments as needed   Social Determinants of Health:  Former cigarette use.  Denies illicit drug use.   Test / Admission - Considered:  Cellulitis Vitals signs within normal range and stable throughout visit. 47 year old male presents emergency department with complaints of red and painful left lower extremity.  Patient symptoms concerning for cellulitis. Patient is with lower extremity swelling bilaterally but without complaints of shortness of  breath, chest pain without auscultatory rales on exam; low suspicion for CHF exacerbation.  Patient without trauma to affected leg or pain with bony tenderness to palpation so low suspicion for acute fracture/dislocation.  Patient with intact pulses bilaterally so low suspicion for PAD/ischemic limb.  Patient with history of DVT but currently anticoagulated on Xarelto with no change in lower extremity swelling from baseline so low suspicion for acute DVT.   Discussion was had with the patient about obtaining laboratory studies as well as x-ray imaging of lower extremity for ruling out of free air the patient declined.  This deemed reasonable given patient with vital signs within normal range and without meeting of SIRS criteria even if leukocytosis present.  Patient without palpable crepitus  on exam so low suspicion for x-ray imaging adding additional information.   Will treat patient empirically with antibiotics with concern for cellulitis and recommend close follow-up within 2 to 3 days for reassessment by primary care provider.  Patient educated regarding strict return precautions of fever, worsening redness/swelling, intractable pain, etc.  Worrisome signs and symptoms were discussed with the patient, and the patient acknowledged understanding to return to the ED if noticed. Patient was stable upon discharge.          Final Clinical Impression(s) / ED Diagnoses Final diagnoses:  Cellulitis of left lower extremity    Rx / DC Orders ED Discharge Orders          Ordered    cephALEXin (KEFLEX) 500 MG capsule  4 times daily        06/25/23 1552              Peter Garter, [place] 06/25/23 1606    Loetta Rough, MD 06/25/23 1819

## 2023-06-25 NOTE — ED Triage Notes (Signed)
Pt arrives with c/o left lower leg pain that started 2 days ago. Pt has hx of cellulitis in the same leg. Pt has some redness and endorses pain in leg. Pt endorses chills and decreased appetite. Pt denies CP or SOB.

## 2023-07-07 ENCOUNTER — Other Ambulatory Visit (HOSPITAL_BASED_OUTPATIENT_CLINIC_OR_DEPARTMENT_OTHER): Payer: Self-pay

## 2023-07-22 ENCOUNTER — Other Ambulatory Visit (HOSPITAL_BASED_OUTPATIENT_CLINIC_OR_DEPARTMENT_OTHER): Payer: Self-pay

## 2023-07-22 MED ORDER — XARELTO 20 MG PO TABS
20.0000 mg | ORAL_TABLET | Freq: Every day | ORAL | 0 refills | Status: DC
  Filled 2023-07-22: qty 30, 30d supply, fill #0

## 2023-08-21 ENCOUNTER — Other Ambulatory Visit (HOSPITAL_BASED_OUTPATIENT_CLINIC_OR_DEPARTMENT_OTHER): Payer: Self-pay

## 2023-08-21 ENCOUNTER — Other Ambulatory Visit: Payer: Self-pay

## 2023-08-21 MED ORDER — XARELTO 20 MG PO TABS
20.0000 mg | ORAL_TABLET | Freq: Every day | ORAL | 0 refills | Status: DC
  Filled 2023-08-21 (×2): qty 30, 30d supply, fill #0

## 2023-09-15 ENCOUNTER — Other Ambulatory Visit (HOSPITAL_BASED_OUTPATIENT_CLINIC_OR_DEPARTMENT_OTHER): Payer: Self-pay

## 2023-09-15 MED ORDER — XARELTO 20 MG PO TABS
20.0000 mg | ORAL_TABLET | Freq: Every day | ORAL | 0 refills | Status: DC
  Filled 2023-09-15 – 2023-10-22 (×2): qty 30, 30d supply, fill #0

## 2023-09-25 ENCOUNTER — Other Ambulatory Visit (HOSPITAL_BASED_OUTPATIENT_CLINIC_OR_DEPARTMENT_OTHER): Payer: Self-pay

## 2023-10-16 DIAGNOSIS — K625 Hemorrhage of anus and rectum: Secondary | ICD-10-CM | POA: Diagnosis not present

## 2023-10-16 DIAGNOSIS — Z87891 Personal history of nicotine dependence: Secondary | ICD-10-CM | POA: Diagnosis not present

## 2023-10-16 DIAGNOSIS — K921 Melena: Secondary | ICD-10-CM | POA: Diagnosis not present

## 2023-10-16 DIAGNOSIS — R600 Localized edema: Secondary | ICD-10-CM | POA: Diagnosis not present

## 2023-10-16 DIAGNOSIS — R1031 Right lower quadrant pain: Secondary | ICD-10-CM | POA: Diagnosis not present

## 2023-10-22 ENCOUNTER — Other Ambulatory Visit (HOSPITAL_BASED_OUTPATIENT_CLINIC_OR_DEPARTMENT_OTHER): Payer: Self-pay

## 2023-10-30 ENCOUNTER — Other Ambulatory Visit (HOSPITAL_BASED_OUTPATIENT_CLINIC_OR_DEPARTMENT_OTHER): Payer: Self-pay

## 2023-10-30 DIAGNOSIS — I480 Paroxysmal atrial fibrillation: Secondary | ICD-10-CM | POA: Diagnosis not present

## 2023-10-30 DIAGNOSIS — K922 Gastrointestinal hemorrhage, unspecified: Secondary | ICD-10-CM | POA: Diagnosis not present

## 2023-10-30 DIAGNOSIS — M5442 Lumbago with sciatica, left side: Secondary | ICD-10-CM | POA: Diagnosis not present

## 2023-10-30 DIAGNOSIS — Z6841 Body Mass Index (BMI) 40.0 and over, adult: Secondary | ICD-10-CM | POA: Diagnosis not present

## 2023-10-30 DIAGNOSIS — G4733 Obstructive sleep apnea (adult) (pediatric): Secondary | ICD-10-CM | POA: Diagnosis not present

## 2023-10-30 DIAGNOSIS — F331 Major depressive disorder, recurrent, moderate: Secondary | ICD-10-CM | POA: Diagnosis not present

## 2023-10-30 DIAGNOSIS — E66813 Obesity, class 3: Secondary | ICD-10-CM | POA: Diagnosis not present

## 2023-10-30 MED ORDER — XARELTO 20 MG PO TABS
20.0000 mg | ORAL_TABLET | Freq: Every day | ORAL | 1 refills | Status: DC
  Filled 2023-10-30: qty 90, 90d supply, fill #0

## 2023-10-30 MED ORDER — SERTRALINE HCL 50 MG PO TABS
50.0000 mg | ORAL_TABLET | Freq: Every day | ORAL | 2 refills | Status: DC
  Filled 2023-10-30: qty 30, 30d supply, fill #0
  Filled 2024-01-29 – 2024-02-09 (×2): qty 30, 30d supply, fill #1

## 2023-11-05 DIAGNOSIS — K219 Gastro-esophageal reflux disease without esophagitis: Secondary | ICD-10-CM | POA: Diagnosis not present

## 2023-11-05 DIAGNOSIS — R195 Other fecal abnormalities: Secondary | ICD-10-CM | POA: Diagnosis not present

## 2023-11-06 ENCOUNTER — Other Ambulatory Visit (HOSPITAL_BASED_OUTPATIENT_CLINIC_OR_DEPARTMENT_OTHER): Payer: Self-pay

## 2023-11-06 MED ORDER — PANTOPRAZOLE SODIUM 40 MG PO TBEC
40.0000 mg | DELAYED_RELEASE_TABLET | ORAL | 2 refills | Status: DC
  Filled 2023-11-06: qty 30, 30d supply, fill #0
  Filled 2023-12-02: qty 30, 30d supply, fill #1
  Filled 2024-01-05: qty 30, 30d supply, fill #2

## 2023-11-07 ENCOUNTER — Telehealth: Payer: Self-pay

## 2023-11-07 NOTE — Telephone Encounter (Signed)
   Pre-operative Risk Assessment    Patient Name: Russell [name]  DOB: 1976-10-20 MRN: [id number]  Last ov:05/20/23 Upcoming visit:11/10/23      Request for Surgical Clearance    Procedure:   EGD/colonoscopy   Date of Surgery:  Clearance TBD                                 Surgeon:  Laurette Schimke. Lewis,MD  Surgeon's Group or Practice Name:  [facility] Phone number:  [phone] Fax number:  [phone]   Type of Clearance Requested:   - Medical  - Pharmacy:  Hold Rivaroxaban (Xarelto) Not indicated    Type of Anesthesia:   Propofol    Additional requests/questions:    Vance Peper   11/07/2023, 1:19 PM

## 2023-11-07 NOTE — Telephone Encounter (Signed)
   Name: Russell [name]  DOB: Dec 18, 1976  MRN: [id number]  Primary Cardiologist: [name], MD  Chart reviewed as part of pre-operative protocol coverage. The patient has an upcoming visit scheduled with Dr.Thukkani on 11/10/23 at which time clearance can be addressed in case there are any issues that would impact surgical recommendations.  I added preop FYI to appointment note so that provider is aware to address at time of outpatient visit.  Per office protocol the cardiology provider should forward their finalized clearance decision and recommendations regarding antiplatelet therapy to the requesting party below.    I will route this message as FYI to requesting party and remove this message from the preop box as separate preop APP input not needed at this time.   Please call with any questions.  Napoleon Form, Leodis Rains, NP  11/07/2023, 1:39 PM

## 2023-11-07 NOTE — Progress Notes (Unsigned)
Cardiology Office Note:   Date:  11/07/2023  ID:  Russell [name], DOB 09-May-1976, MRN [id number] PCP:  Russell Finders, PA-C  [facility] Providers Cardiologist:  Russell Skeans, MD Referring MD: Russell Finders, PA-C  Chief Complaint/Reason for Referral: Cardiology follow-up ASSESSMENT:    1. Abnormal liver function   2. Pericardial calcification   3. Paroxysmal atrial fibrillation (HCC)   4. BMI 45.0-49.9, adult (HCC)   5. Deep vein thrombosis (DVT) of proximal lower extremity, unspecified chronicity, unspecified laterality (HCC)     PLAN:   In order of problems listed above: Abnormal liver function: Will obtain CMP today to evaluate further.  His last echocardiogram demonstrated normal RV function and no suggestion of constriction.  If the LFTs are abnormal I will consider cardiac MRI to evaluate for constriction. Pericardial calcification: See discussion above. Paroxysmal atrial fibrillation: Continue Xarelto and Multaq.  Will obtain CMP for Multaq monitoring.*** Elevated BMI: Unfortunate the patient does not qualify for GLP-1 receptor agonist. Deep vein thrombosis: Seemingly unprovoked and previous decision made to anticoagulate indefinitely.        {Are you ordering a CV Procedure (e.g. stress test, cath, DCCV, TEE, etc)?   Press F2        :[id number]}   Dispo:  No follow-ups on file.      Medication Adjustments/Labs and Tests Ordered: Current medicines are reviewed at length with the patient today.  Concerns regarding medicines are outlined above.  The following changes have been made:  {PLAN; NO CHANGE:13088:s}   Labs/tests ordered: No orders of the defined types were placed in this encounter.   Medication Changes: No orders of the defined types were placed in this encounter.   Current medicines are reviewed at length with the patient today.  The patient {ACTIONS; HAS/DOES NOT HAVE:19233} concerns regarding medicines.  I spent *** minutes reviewing all  clinical data during and prior to this visit including all relevant imaging studies, laboratories, clinical information from other health systems, and prior notes from both Cardiology and other specialties, interviewing the patient, and conducting a complete physical examination in order to formulate a comprehensive and personalized evaluation and treatment plan.  History of Present Illness:      FOCUSED PROBLEM LIST:   Atrial flutter Ablation 2021 Atrial fibrillation CV 2 score 0 Followed by atrial fibrillation clinic BMI 48 DVT April 2023 Unprovoked; on chronic Xarelto Pericardial calcification Chest CT 2021 TTE without findings suggestive of constriction; EF 50 to 55% 2023  May 2023: The patient is a 47 y.o. male with the indicated medical history here to establish general cardiology care.  The patient was seen by his primary care provider recently.  He had developed a DVT and was seen in the emergency department at an outside facility.  He had no inciting factors.  He was started on Xarelto.  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   He is tolerating Xarelto well.  He denies any significant shortness of breath, chest pain, palpitations, paroxysmal nocturnal dyspnea, orthopnea.  He is working to get fitted for CPAP though he cannot afford the co-pay right now.  He is required no recent emergency room visits or hospitalizations since being in the hospital for his DVT.  He is otherwise well without complaints.  Plan: Obtain echocardiogram; refer to  pharmacy given elevated BMI.   May 2024: In the interim the patient was seen by pharmacy and started on Wegovy.  Unfortunately his insurance would not cover this and so this was stopped.  An echocardiogram was done which demonstrated a  thickened and calcified pericardium with no evidence of tamponade.  His ejection fraction was preserved.  He had no significant valvular abnormalities.  He was seen in August of last year due to bleeding controlled with gauze, Coban, and lidocaine with epinephrine injection.  He was seen by his PCP and they elected to pursue indefinite anticoagulation due to his unprovoked DVT.  The patient is otherwise doing well.  He denies any shortness of breath, presyncope, syncope, paroxysmal nocturnal dyspnea, or palpitations.  He does have occasional dependent edema.  He fortunately has not required hospitalization.  Plan: Follow-up in 18 months.  November 2024: The patient is referred for cardiology opinion.  Apparently the patient had abnormal LFTs and his PCP believes that this is due to right heart failure.  On my review of his labs the patient's bilirubin is mildly elevated.          Current Medications: No outpatient medications have been marked as taking for the 11/10/23 encounter (Appointment) with Russell Pyo, MD.     Review of Systems:   Please see the history of present illness.    All other systems reviewed and are negative.     EKGs/Labs/Other Test Reviewed:   EKG: EKG from May 2024 demonstrates sinus bradycardia with sinus arrhythmia  EKG Interpretation Date/Time:    Ventricular Rate:    PR Interval:    QRS Duration:    QT Interval:    QTC Calculation:   R Axis:      Text Interpretation:           Risk Assessment/Calculations:   {Does this patient have ATRIAL FIBRILLATION?[phone]}      Physical Exam:   VS:  There were no vitals taken for this visit.   No BP recorded.  {Refresh Note OR Click here to enter BP  :1}***   Wt Readings from Last 3 Encounters:  06/25/23 (!) 303 lb (137.4 kg)  05/20/23 (!) 324 lb (147 kg)  06/02/22 (!) 335 lb (152 kg)      GENERAL:  No apparent distress, AOx3 HEENT:  No carotid bruits, +2 carotid impulses, no scleral  icterus CAR: RRR Irregular RR*** no murmurs***, gallops, rubs, or thrills RES:  Clear to auscultation bilaterally ABD:  Soft, nontender, nondistended, positive bowel sounds x 4 VASC:  +2 radial pulses, +2 carotid pulses NEURO:  CN 2-12 grossly intact; motor and sensory grossly intact PSYCH:  No active depression or anxiety EXT:  No edema, ecchymosis, or cyanosis  Signed, Russell Pyo, MD  11/07/2023 12:17 PM    [facility] [address] [place], [place], [place]  [address] Phone: [phone]; Fax: [phone]   Note:  This document was prepared using Dragon voice recognition software and may include unintentional dictation errors.

## 2023-11-10 ENCOUNTER — Ambulatory Visit: Payer: BC Managed Care – PPO | Attending: Internal Medicine | Admitting: Internal Medicine

## 2023-11-10 ENCOUNTER — Encounter: Payer: Self-pay | Admitting: Internal Medicine

## 2023-11-10 VITALS — BP 112/86 | HR 70 | Ht 68.0 in | Wt 324.0 lb

## 2023-11-10 DIAGNOSIS — R945 Abnormal results of liver function studies: Secondary | ICD-10-CM

## 2023-11-10 DIAGNOSIS — Z01810 Encounter for preprocedural cardiovascular examination: Secondary | ICD-10-CM

## 2023-11-10 DIAGNOSIS — Z6841 Body Mass Index (BMI) 40.0 and over, adult: Secondary | ICD-10-CM

## 2023-11-10 DIAGNOSIS — I480 Paroxysmal atrial fibrillation: Secondary | ICD-10-CM

## 2023-11-10 DIAGNOSIS — I318 Other specified diseases of pericardium: Secondary | ICD-10-CM

## 2023-11-10 DIAGNOSIS — I824Y9 Acute embolism and thrombosis of unspecified deep veins of unspecified proximal lower extremity: Secondary | ICD-10-CM

## 2023-11-10 NOTE — Patient Instructions (Signed)
Medication Instructions:   Your physician recommends that you continue on your current medications as directed. Please refer to the Current Medication list given to you today.  *If you need a refill on your cardiac medications before your next appointment, please call your pharmacy*   Lab Work:  TODAY--CMET  If you have labs (blood work) drawn today and your tests are completely normal, you will receive your results only by: MyChart Message (if you have MyChart) OR A paper copy in the mail If you have any lab test that is abnormal or we need to change your treatment, we will call you to review the results.   Testing/Procedures:  Your physician has requested that you have an echocardiogram. Echocardiography is a painless test that uses sound waves to create images of your heart. It provides your doctor with information about the size and shape of your heart and how well your heart's chambers and valves are working. This procedure takes approximately one hour. There are no restrictions for this procedure. Please do NOT wear cologne, perfume, aftershave, or lotions (deodorant is allowed). Please arrive 15 minutes prior to your appointment time.  Please note: We ask at that you not bring children with you during ultrasound (echo/ vascular) testing. Due to room size and safety concerns, children are not allowed in the ultrasound rooms during exams. Our front office staff cannot provide observation of children in our lobby area while testing is being conducted. An adult accompanying a patient to their appointment will only be allowed in the ultrasound room at the discretion of the ultrasound technician under special circumstances. We apologize for any inconvenience.    Follow-Up: At [facility], you and your health needs are our priority.  As part of our continuing mission to provide you with exceptional heart care, we have created designated Provider Care Teams.  These Care Teams  include your primary Cardiologist (physician) and Advanced Practice Providers (APPs -  Physician Assistants and Nurse Practitioners) who all work together to provide you with the care you need, when you need it.  We recommend signing up for the patient portal called "MyChart".  Sign up information is provided on this After Visit Summary.  MyChart is used to connect with patients for Virtual Visits (Telemedicine).  Patients are able to view lab/test results, encounter notes, upcoming appointments, etc.  Non-urgent messages can be sent to your provider as well.   To learn more about what you can do with MyChart, go to [email or web address].    Your next appointment:   6 month(s)  Provider:   [name], MD

## 2023-11-11 LAB — COMPREHENSIVE METABOLIC PANEL
ALT: 15 [IU]/L (ref 0–44)
AST: 22 [IU]/L (ref 0–40)
Albumin: 4.2 g/dL (ref 4.1–5.1)
Alkaline Phosphatase: 73 [IU]/L (ref 44–121)
BUN/Creatinine Ratio: 18 (ref 9–20)
BUN: 18 mg/dL (ref 6–24)
Bilirubin Total: 1 mg/dL (ref 0.0–1.2)
CO2: 24 mmol/L (ref 20–29)
Calcium: 9.2 mg/dL (ref 8.7–10.2)
Chloride: 104 mmol/L (ref 96–106)
Creatinine, Ser: 1.02 mg/dL (ref 0.76–1.27)
Globulin, Total: 3.2 g/dL (ref 1.5–4.5)
Glucose: 78 mg/dL (ref 70–99)
Potassium: 4.5 mmol/L (ref 3.5–5.2)
Sodium: 140 mmol/L (ref 134–144)
Total Protein: 7.4 g/dL (ref 6.0–8.5)
eGFR: 92 mL/min/{1.73_m2} (ref >59)

## 2023-11-20 ENCOUNTER — Encounter: Payer: Self-pay | Admitting: Internal Medicine

## 2023-11-20 DIAGNOSIS — K760 Fatty (change of) liver, not elsewhere classified: Secondary | ICD-10-CM | POA: Diagnosis not present

## 2023-11-20 DIAGNOSIS — R932 Abnormal findings on diagnostic imaging of liver and biliary tract: Secondary | ICD-10-CM | POA: Diagnosis not present

## 2023-11-20 DIAGNOSIS — R112 Nausea with vomiting, unspecified: Secondary | ICD-10-CM | POA: Diagnosis not present

## 2023-11-20 DIAGNOSIS — K625 Hemorrhage of anus and rectum: Secondary | ICD-10-CM | POA: Diagnosis not present

## 2023-11-21 ENCOUNTER — Telehealth: Payer: Self-pay

## 2023-11-21 NOTE — Telephone Encounter (Signed)
   Pre-operative Risk Assessment    Patient Name: Russell [name]  DOB: 05/17/1976 MRN: [id number]     Request for Surgical Clearance    Procedure:  endoscopy and colonoscopy   Date of Surgery:  Clearance 01/22/24                                 Surgeon:  not listed  Surgeon's Group or Practice Name:  [facility]  Phone number:  [phone] Fax number:  [phone]   Type of Clearance Requested:   - Pharmacy:  Hold Rivaroxaban (Xarelto) 2 days prior    Type of Anesthesia:  propofol    Additional requests/questions:    Scarlette Shorts   11/21/2023, 12:32 PM

## 2023-11-24 ENCOUNTER — Ambulatory Visit: Payer: BC Managed Care – PPO | Admitting: Cardiology

## 2023-11-30 NOTE — Telephone Encounter (Signed)
Patient with diagnosis of A Fib on Xarelto for anticoagulation.    Procedure: endoscopy and colonoscopy  Date of procedure: 123/25   CHA2DS2-VASc Score = 1  This indicates a 0.6% annual risk of stroke. The patient's score is based upon: CHF History: 0 HTN History: 0 Diabetes History: 0 Stroke History: 0 Vascular Disease History: 1 Age Score: 0 Gender Score: 0    CrCl 126 mL/min using adj body weight Platelet count 125K  Per office protocol, patient can hold Xarelto for 2 days prior to procedure.    **This guidance is not considered finalized until pre-operative APP has relayed final recommendations.**

## 2023-12-03 NOTE — Telephone Encounter (Signed)
Patient was seen by Dr. Lynnette Caffey on 11/10/2023. Echo was ordered for further evaluation of abnormal liver function and pre-op risk assessment. This is scheduled for 12/19/2023.  Will need to wait for results from this before providing final risk assessment.  Corrin Parker, PA-C 12/03/2023 9:40 AM

## 2023-12-10 NOTE — Telephone Encounter (Signed)
Requesting office sent duplicate. I will send update the pt is scheduled for an echo 12/19/23 per Dr. Lynnette Caffey before he will sign off on clearance.   Once the pt has been cleared our office will fax over clearance notes.

## 2023-12-11 ENCOUNTER — Other Ambulatory Visit (HOSPITAL_BASED_OUTPATIENT_CLINIC_OR_DEPARTMENT_OTHER): Payer: Self-pay

## 2023-12-11 DIAGNOSIS — R932 Abnormal findings on diagnostic imaging of liver and biliary tract: Secondary | ICD-10-CM | POA: Diagnosis not present

## 2023-12-11 DIAGNOSIS — R160 Hepatomegaly, not elsewhere classified: Secondary | ICD-10-CM | POA: Diagnosis not present

## 2023-12-11 DIAGNOSIS — K760 Fatty (change of) liver, not elsewhere classified: Secondary | ICD-10-CM | POA: Diagnosis not present

## 2023-12-11 DIAGNOSIS — F331 Major depressive disorder, recurrent, moderate: Secondary | ICD-10-CM | POA: Diagnosis not present

## 2023-12-11 MED ORDER — SERTRALINE HCL 50 MG PO TABS
75.0000 mg | ORAL_TABLET | Freq: Every day | ORAL | 1 refills | Status: DC
  Filled 2023-12-11: qty 135, 90d supply, fill #0
  Filled 2024-01-05 – 2024-02-17 (×3): qty 135, 90d supply, fill #1

## 2023-12-15 DIAGNOSIS — K7460 Unspecified cirrhosis of liver: Secondary | ICD-10-CM | POA: Diagnosis not present

## 2023-12-17 ENCOUNTER — Other Ambulatory Visit (HOSPITAL_COMMUNITY): Payer: BC Managed Care – PPO

## 2023-12-19 ENCOUNTER — Ambulatory Visit (HOSPITAL_COMMUNITY): Payer: BC Managed Care – PPO | Attending: Internal Medicine

## 2023-12-19 DIAGNOSIS — R945 Abnormal results of liver function studies: Secondary | ICD-10-CM | POA: Insufficient documentation

## 2023-12-19 DIAGNOSIS — I480 Paroxysmal atrial fibrillation: Secondary | ICD-10-CM | POA: Diagnosis not present

## 2023-12-19 LAB — ECHOCARDIOGRAM COMPLETE
Calc EF: 42.8 %
S' Lateral: 3.44 cm
Single Plane A2C EF: 46.5 %
Single Plane A4C EF: 42.2 %

## 2023-12-19 MED ORDER — PERFLUTREN LIPID MICROSPHERE
1.0000 mL | INTRAVENOUS | Status: AC | PRN
  Administered 2023-12-19: 4 mL via INTRAVENOUS

## 2023-12-22 ENCOUNTER — Telehealth: Payer: Self-pay

## 2023-12-22 ENCOUNTER — Other Ambulatory Visit (HOSPITAL_BASED_OUTPATIENT_CLINIC_OR_DEPARTMENT_OTHER): Payer: Self-pay

## 2023-12-22 DIAGNOSIS — I318 Other specified diseases of pericardium: Secondary | ICD-10-CM

## 2023-12-22 DIAGNOSIS — R945 Abnormal results of liver function studies: Secondary | ICD-10-CM

## 2023-12-22 MED ORDER — XARELTO 20 MG PO TABS
20.0000 mg | ORAL_TABLET | Freq: Every day | ORAL | 2 refills | Status: DC
  Filled 2023-12-22: qty 30, 30d supply, fill #0
  Filled 2024-01-05 – 2024-01-29 (×2): qty 30, 30d supply, fill #1
  Filled 2024-02-27: qty 30, 30d supply, fill #2

## 2023-12-22 NOTE — Telephone Encounter (Signed)
     Primary Cardiologist: [name], MD  Chart reviewed as part of pre-operative protocol coverage. Given past medical history and time since last visit, based on ACC/AHA guidelines, Russell [name] would be at acceptable risk for the planned procedure without further cardiovascular testing.   Patient with diagnosis of A Fib on Xarelto for anticoagulation.     Procedure: endoscopy and colonoscopy  Date of procedure: 123/25     CHA2DS2-VASc Score = 1  This indicates a 0.6% annual risk of stroke. The patient's score is based upon: CHF History: 0 HTN History: 0 Diabetes History: 0 Stroke History: 0 Vascular Disease History: 1 Age Score: 0 Gender Score: 0     CrCl 126 mL/min using adj body weight Platelet count 125K   Per office protocol, patient can hold Xarelto for 2 days prior to procedure.   I will route this recommendation to the requesting party via Epic fax function and remove from pre-op pool.  Please call with questions.  Thomasene Ripple. Michiel Sivley NP-C     12/22/2023, 9:21 AM [facility] [address] Office [phone] Fax [phone]

## 2023-12-22 NOTE — Telephone Encounter (Signed)
Patient made aware. Order in, instructions reviewed and sent to Mychart

## 2023-12-23 ENCOUNTER — Other Ambulatory Visit (HOSPITAL_BASED_OUTPATIENT_CLINIC_OR_DEPARTMENT_OTHER): Payer: Self-pay

## 2024-01-05 ENCOUNTER — Other Ambulatory Visit (HOSPITAL_BASED_OUTPATIENT_CLINIC_OR_DEPARTMENT_OTHER): Payer: Self-pay

## 2024-01-05 ENCOUNTER — Other Ambulatory Visit (HOSPITAL_COMMUNITY): Payer: Self-pay | Admitting: Physician Assistant

## 2024-01-05 MED ORDER — MULTAQ 400 MG PO TABS
400.0000 mg | ORAL_TABLET | Freq: Two times a day (BID) | ORAL | 0 refills | Status: DC
  Filled 2024-01-05: qty 60, 30d supply, fill #0

## 2024-01-13 ENCOUNTER — Other Ambulatory Visit (HOSPITAL_BASED_OUTPATIENT_CLINIC_OR_DEPARTMENT_OTHER): Payer: Self-pay

## 2024-01-21 ENCOUNTER — Other Ambulatory Visit (HOSPITAL_BASED_OUTPATIENT_CLINIC_OR_DEPARTMENT_OTHER): Payer: Self-pay

## 2024-01-21 MED ORDER — NA SULFATE-K SULFATE-MG SULF 17.5-3.13-1.6 GM/177ML PO SOLN
177.0000 mL | Freq: Two times a day (BID) | ORAL | 0 refills | Status: DC
  Filled 2024-01-21: qty 354, 1d supply, fill #0

## 2024-01-22 DIAGNOSIS — Z91199 Patient's noncompliance with other medical treatment and regimen due to unspecified reason: Secondary | ICD-10-CM | POA: Diagnosis not present

## 2024-01-22 DIAGNOSIS — G4733 Obstructive sleep apnea (adult) (pediatric): Secondary | ICD-10-CM | POA: Diagnosis not present

## 2024-01-22 DIAGNOSIS — I824Z1 Acute embolism and thrombosis of unspecified deep veins of right distal lower extremity: Secondary | ICD-10-CM | POA: Diagnosis not present

## 2024-01-22 DIAGNOSIS — I480 Paroxysmal atrial fibrillation: Secondary | ICD-10-CM | POA: Diagnosis not present

## 2024-01-22 DIAGNOSIS — Z87891 Personal history of nicotine dependence: Secondary | ICD-10-CM | POA: Diagnosis not present

## 2024-01-22 DIAGNOSIS — K648 Other hemorrhoids: Secondary | ICD-10-CM | POA: Diagnosis not present

## 2024-01-22 DIAGNOSIS — R112 Nausea with vomiting, unspecified: Secondary | ICD-10-CM | POA: Diagnosis not present

## 2024-01-22 DIAGNOSIS — K449 Diaphragmatic hernia without obstruction or gangrene: Secondary | ICD-10-CM | POA: Diagnosis not present

## 2024-01-22 DIAGNOSIS — D122 Benign neoplasm of ascending colon: Secondary | ICD-10-CM | POA: Diagnosis not present

## 2024-01-22 DIAGNOSIS — Z7901 Long term (current) use of anticoagulants: Secondary | ICD-10-CM | POA: Diagnosis not present

## 2024-01-22 DIAGNOSIS — F331 Major depressive disorder, recurrent, moderate: Secondary | ICD-10-CM | POA: Diagnosis not present

## 2024-01-22 DIAGNOSIS — K625 Hemorrhage of anus and rectum: Secondary | ICD-10-CM | POA: Diagnosis not present

## 2024-01-22 DIAGNOSIS — Z79899 Other long term (current) drug therapy: Secondary | ICD-10-CM | POA: Diagnosis not present

## 2024-01-22 DIAGNOSIS — K219 Gastro-esophageal reflux disease without esophagitis: Secondary | ICD-10-CM | POA: Diagnosis not present

## 2024-01-22 DIAGNOSIS — K635 Polyp of colon: Secondary | ICD-10-CM | POA: Diagnosis not present

## 2024-01-30 ENCOUNTER — Other Ambulatory Visit (HOSPITAL_BASED_OUTPATIENT_CLINIC_OR_DEPARTMENT_OTHER): Payer: Self-pay

## 2024-02-06 ENCOUNTER — Encounter: Payer: Self-pay | Admitting: Internal Medicine

## 2024-02-09 ENCOUNTER — Other Ambulatory Visit (HOSPITAL_BASED_OUTPATIENT_CLINIC_OR_DEPARTMENT_OTHER): Payer: Self-pay

## 2024-02-09 ENCOUNTER — Other Ambulatory Visit (HOSPITAL_COMMUNITY): Payer: Self-pay | Admitting: Physician Assistant

## 2024-02-09 MED ORDER — MULTAQ 400 MG PO TABS
400.0000 mg | ORAL_TABLET | Freq: Two times a day (BID) | ORAL | 0 refills | Status: DC
  Filled 2024-02-09: qty 60, 30d supply, fill #0

## 2024-02-10 ENCOUNTER — Other Ambulatory Visit (HOSPITAL_BASED_OUTPATIENT_CLINIC_OR_DEPARTMENT_OTHER): Payer: Self-pay

## 2024-02-10 MED ORDER — PANTOPRAZOLE SODIUM 40 MG PO TBEC
40.0000 mg | DELAYED_RELEASE_TABLET | ORAL | 2 refills | Status: DC
  Filled 2024-02-10: qty 30, 30d supply, fill #0
  Filled 2024-03-12: qty 30, 30d supply, fill #1
  Filled 2024-04-02 – 2024-04-05 (×2): qty 30, 30d supply, fill #2

## 2024-02-12 ENCOUNTER — Ambulatory Visit (HOSPITAL_COMMUNITY): Payer: BC Managed Care – PPO | Admitting: Physician Assistant

## 2024-02-16 ENCOUNTER — Other Ambulatory Visit (HOSPITAL_BASED_OUTPATIENT_CLINIC_OR_DEPARTMENT_OTHER): Payer: Self-pay

## 2024-02-17 ENCOUNTER — Encounter (HOSPITAL_COMMUNITY): Payer: Self-pay

## 2024-02-19 ENCOUNTER — Ambulatory Visit (HOSPITAL_COMMUNITY)
Admission: RE | Admit: 2024-02-19 | Discharge: 2024-02-19 | Disposition: A | Discharge status: Home / Self Care | Payer: BC Managed Care – PPO | Source: Ambulatory Visit | Attending: Internal Medicine | Admitting: Internal Medicine

## 2024-02-19 ENCOUNTER — Other Ambulatory Visit: Payer: Self-pay | Admitting: Internal Medicine

## 2024-02-19 DIAGNOSIS — I318 Other specified diseases of pericardium: Secondary | ICD-10-CM | POA: Insufficient documentation

## 2024-02-19 DIAGNOSIS — R945 Abnormal results of liver function studies: Secondary | ICD-10-CM

## 2024-02-19 MED ORDER — GADOBUTROL 1 MMOL/ML IV SOLN
10.0000 mL | Freq: Once | INTRAVENOUS | Status: AC | PRN
  Administered 2024-02-19: 10 mL via INTRAVENOUS

## 2024-02-21 ENCOUNTER — Encounter: Payer: Self-pay | Admitting: Internal Medicine

## 2024-02-23 ENCOUNTER — Ambulatory Visit (HOSPITAL_COMMUNITY)
Admission: RE | Admit: 2024-02-23 | Discharge: 2024-02-23 | Disposition: A | Discharge status: Home / Self Care | Payer: BC Managed Care – PPO | Source: Ambulatory Visit | Attending: Physician Assistant | Admitting: Physician Assistant

## 2024-02-23 VITALS — BP 104/72 | HR 77 | Ht 68.0 in | Wt 279.4 lb

## 2024-02-23 DIAGNOSIS — Z6841 Body Mass Index (BMI) 40.0 and over, adult: Secondary | ICD-10-CM | POA: Diagnosis not present

## 2024-02-23 DIAGNOSIS — I5022 Chronic systolic (congestive) heart failure: Secondary | ICD-10-CM | POA: Diagnosis not present

## 2024-02-23 DIAGNOSIS — Z79899 Other long term (current) drug therapy: Secondary | ICD-10-CM | POA: Diagnosis not present

## 2024-02-23 DIAGNOSIS — I483 Typical atrial flutter: Secondary | ICD-10-CM | POA: Diagnosis not present

## 2024-02-23 DIAGNOSIS — I4819 Other persistent atrial fibrillation: Secondary | ICD-10-CM | POA: Insufficient documentation

## 2024-02-23 DIAGNOSIS — E669 Obesity, unspecified: Secondary | ICD-10-CM | POA: Diagnosis not present

## 2024-02-23 DIAGNOSIS — I4891 Unspecified atrial fibrillation: Secondary | ICD-10-CM | POA: Diagnosis not present

## 2024-02-23 DIAGNOSIS — G4733 Obstructive sleep apnea (adult) (pediatric): Secondary | ICD-10-CM | POA: Insufficient documentation

## 2024-02-23 DIAGNOSIS — Z5181 Encounter for therapeutic drug level monitoring: Secondary | ICD-10-CM | POA: Insufficient documentation

## 2024-02-23 DIAGNOSIS — I4892 Unspecified atrial flutter: Secondary | ICD-10-CM | POA: Diagnosis not present

## 2024-02-23 DIAGNOSIS — Z7901 Long term (current) use of anticoagulants: Secondary | ICD-10-CM | POA: Insufficient documentation

## 2024-02-23 DIAGNOSIS — Z86718 Personal history of other venous thrombosis and embolism: Secondary | ICD-10-CM | POA: Diagnosis not present

## 2024-02-23 LAB — CBC
HCT: 37 % — ABNORMAL LOW (ref 39.0–52.0)
Hemoglobin: 12.2 g/dL — ABNORMAL LOW (ref 13.0–17.0)
MCH: 32.9 pg (ref 26.0–34.0)
MCHC: 33 g/dL (ref 30.0–36.0)
MCV: 99.7 fL (ref 80.0–100.0)
Platelets: 110 10*3/uL — ABNORMAL LOW (ref 150–400)
RBC: 3.71 MIL/uL — ABNORMAL LOW (ref 4.22–5.81)
RDW: 14.6 % (ref 11.5–15.5)
WBC: 4.7 10*3/uL (ref 4.0–10.5)
nRBC: 0 % (ref 0.0–0.2)

## 2024-02-23 LAB — BASIC METABOLIC PANEL
Anion gap: 12 (ref 5–15)
BUN: 18 mg/dL (ref 6–20)
CO2: 21 mmol/L — ABNORMAL LOW (ref 22–32)
Calcium: 9.4 mg/dL (ref 8.9–10.3)
Chloride: 105 mmol/L (ref 98–111)
Creatinine, Ser: 0.75 mg/dL (ref 0.61–1.24)
GFR, Estimated: >60 mL/min (ref >60)
Glucose, Bld: 83 mg/dL (ref 70–99)
Potassium: 3.6 mmol/L (ref 3.5–5.1)
Sodium: 138 mmol/L (ref 135–145)

## 2024-02-23 NOTE — Patient Instructions (Signed)
 Call in 1 week with update of your watch readings. [phone]

## 2024-02-23 NOTE — Progress Notes (Addendum)
 Primary Care Physician: Norberto Sorenson, NP Primary Cardiologist: Dr Lynnette Caffey Primary Electrophysiologist: Dr Ladona Ridgel Referring Physician: [facility]   ARLAN BIRKS is a 48 y.o. male with a history of atrial flutter, atrial fibrillation, DVT, OSA who presents for follow up in the [facility].  The patient was initially diagnosed with atrial flutter in [place] 08/2017 and has had numerous cardioversions. Caffeine and Sprite appear to have been triggers for him. He has been on amiodarone in the past. Patient is not on long term anticoagulation with a CHADS2VASC score of 0. Patient has not followed up after his previous cardoversions due to a lack of insurance. He was seen in the ER on 03/02/20 with symptoms of palpitations and underwent successful DCCV. He was started on Xarelto. He denies any history of afib. He denies significant alcohol use. He has OSA but has not been on CPAP 2/2 cost.  Patient is s/p atrial flutter ablation with Dr Ladona Ridgel on 04/17/20. He has subsequently developed atrial fibrillation and underwent DCCV in the ED 08/15/20. He felt he was back in afib with symptoms of palpitations and SOB on 08/17/20. He presented to the ED in rate controlled afib. He spontaneously converted in the waiting room.  Patient returns for follow up for atrial fibrillation and Multaq monitoring. Patient is in afib today but is asymptomatic and rate controlled. He periodically has alerts on his smart watch for afib. This has been going on for 2-3 months. No bleeding issues on anticoagulation. He has done very well with weight loss through diet and exercise.   Today, he denies symptoms of palpitations, chest pain, shortness of breath, orthopnea, PND, lower extremity edema, dizziness, presyncope, syncope, bleeding, or neurologic sequela. The patient is tolerating medications without difficulties and is otherwise without complaint today.    Atrial Fibrillation Risk Factors:  he  does have symptoms or diagnosis of sleep apnea. he does not have a history of rheumatic fever. he does have a history of alcohol use. The patient does not have a history of early familial atrial fibrillation or other arrhythmias.   Atrial Fibrillation Management history:  Previous antiarrhythmic drugs: amiodarone, Multaq Previous cardioversions: several, most recently 03/02/20, 08/15/20 Previous ablations: none Anticoagulation history: Xarelto   Past Medical History:  Diagnosis Date   Atrial fibrillation (HCC)    Atrial flutter (HCC)    Cellulitis     Current Outpatient Medications  Medication Sig Dispense Refill   Acetaminophen (TYLENOL PO) Take 3 tablets by mouth as needed. Extra strength Tylenol - 500mg      dronedarone (MULTAQ) 400 MG tablet Take 1 tablet (400 mg total) by mouth 2 (two) times daily with a meal. 60 tablet 0   Multiple Vitamin (ONE-A-DAY MENS PO) Take 1 tablet by mouth in the morning.     pantoprazole (PROTONIX) 40 MG tablet Take 1 tablet (40 mg total) by mouth every morning before breakfast. 30 tablet 2   rivaroxaban (XARELTO) 20 MG TABS tablet Take 1 tablet (20 mg total) by mouth daily. 30 tablet 2   sertraline (ZOLOFT) 50 MG tablet Take 1.5 tablets (75 mg total) by mouth daily. 135 tablet 1   No current facility-administered medications for this encounter.    ROS- All systems are reviewed and negative except as per the HPI above.  Physical Exam: Vitals:   02/23/24 1547  BP: 104/72  Pulse: 77  Weight: 126.7 kg  Height: 5\' 8"  (1.727 m)     GEN: Well nourished,  well developed in no acute distress CARDIAC: Irregularly irregular rate and rhythm, no murmurs, rubs, gallops RESPIRATORY:  Clear to auscultation without rales, wheezing or rhonchi  ABDOMEN: Soft, non-tender, non-distended EXTREMITIES:  No edema; No deformity    Wt Readings from Last 3 Encounters:  02/23/24 126.7 kg  11/10/23 (!) 147 kg  06/25/23 (!) 137.4 kg    EKG today demonstrates   Afib Vent. rate 77 BPM PR interval * ms QRS duration 88 ms QT/QTcB 394/445 ms   Echo 12/19/23 demonstrates  1. Left ventricular ejection fraction, by estimation, is 40 to 45%. Left  ventricular ejection fraction by 2D MOD biplane is 42.8 %. The left  ventricle has mildly decreased function. The left ventricle demonstrates  global hypokinesis. Left ventricular diastolic function could not be evaluated.   2. Right ventricular systolic function is moderately reduced. The right  ventricular size is normal. There is normal pulmonary artery systolic  pressure.   3. The mitral valve is grossly normal. No evidence of mitral valve  regurgitation.   4. The aortic valve was not well visualized. Aortic valve regurgitation  is not visualized.   5. The inferior vena cava is dilated in size with <50% respiratory  variability, suggesting right atrial pressure of 15 mmHg.   Comparison(s): Changes from prior study are noted. 06/03/2022: LVEF 50-55%.    Epic records are reviewed at length today  CHA2DS2-VASc Score = 2  The patient's score is based upon: CHF History: 1 HTN History: 0 Diabetes History: 0 Stroke History: 0 Vascular Disease History: 1 Age Score: 0 Gender Score: 0       ASSESSMENT AND PLAN: Persistent Atrial Fibrillation/typical atrial flutter The patient's CHA2DS2-VASc score is 2, indicating a 2.2% annual risk of stroke.   S/p atrial flutter ablation 04/17/20 Patient in rate controlled afib today. Possible he has been persistent. ECG from 10/2023 shows afib. Patient will check his Apple Watch twice daily to see if he is persistently in afib. If so, will arrange for DCCV. Check bmet/cbc today.  Continue Multaq 400 mg BID Continue Xarelto 20 mg daily (unprovoked DVT). He did miss a dose of Xarelto on 2/18. DCCV will need to be at least 3 weeks after that date.  If he is paroxysmal with a high burden of afib, may need alternate AAD.   High Risk Medication Monitoring (ICD 10:  Z79.899) Intervals on ECG appropriate for Multaq monitoring.   Obesity Body mass index is 42.48 kg/m.  Encouraged lifestyle modification  OSA  Patient is not currently on CPAP  HFmrEF EF 40-45 on echo, 48% on MRI OK to continue Multaq GDMT per primary cardiology team Fluid status appears stable today   Follow up pending patient call regarding Apple Watch readings.   Informed Consent   Shared Decision Making/Informed Consent The risks (stroke, cardiac arrhythmias rarely resulting in the need for a temporary or permanent pacemaker, skin irritation or burns and complications associated with conscious sedation including aspiration, arrhythmia, respiratory failure and death), benefits (restoration of normal sinus rhythm) and alternatives of a direct current cardioversion were explained in detail to Mr. Woerner and he agrees to proceed.       Jorja Loa PA-C [facility] [address] [place], [place] [address] [phone] 02/23/2024 4:18 PM

## 2024-02-25 ENCOUNTER — Telehealth: Payer: Self-pay

## 2024-02-25 ENCOUNTER — Other Ambulatory Visit (HOSPITAL_BASED_OUTPATIENT_CLINIC_OR_DEPARTMENT_OTHER): Payer: Self-pay

## 2024-02-25 DIAGNOSIS — I318 Other specified diseases of pericardium: Secondary | ICD-10-CM

## 2024-02-25 MED ORDER — COLCHICINE 0.6 MG PO TABS
0.6000 mg | ORAL_TABLET | Freq: Two times a day (BID) | ORAL | 0 refills | Status: DC
  Filled 2024-02-25: qty 180, 90d supply, fill #0

## 2024-02-25 MED ORDER — IBUPROFEN 600 MG PO TABS
600.0000 mg | ORAL_TABLET | Freq: Three times a day (TID) | ORAL | 0 refills | Status: AC
  Filled 2024-02-25: qty 42, 14d supply, fill #0

## 2024-02-25 NOTE — Telephone Encounter (Signed)
 The patient has been notified of the result and verbalized understanding.  All questions (if any) were answered. Arvid Right Giara Mcgaughey, RN 02/25/2024 3:36 PM   Pt advised to take pantoprazole 40 mg along with new medications.  Advised of s/e of colchicine.   Reviewed med alerts with pharmacist who reports pt CrCl stable short term use okay.    "this patient renal function is good so despite Multaq lowering colchicine metabolism it is ok to use 0.5 mg twice daily; he will clear out good - warn patient for s/e abdominal pain, diarrhea, nausea and vomiting  NSAID will enhance blood thinning effect of DOAC but short term use is ok warn pt not to use any other NSAID- OTC  VP in fact Multaq as CYP inhibitor it increase Xarelto level too however CrCl is good so it would be ok! "

## 2024-02-25 NOTE — Telephone Encounter (Signed)
-----   Message from [name] sent at 02/21/2024  7:00 AM EST ----- His CMR demonstrates pericarditis.  Please have him get CRP drawn, start ibuprofen 600mg  TID x 2 weeks, colchicine 0.6mg  BID x 3 months, and have him come in to see me in 1-2 weeks.

## 2024-02-26 NOTE — Telephone Encounter (Signed)
 Called pt scheduled OV with MD 03/10/24 at 10:40 am.

## 2024-02-26 NOTE — Addendum Note (Signed)
 Addended by: Macie Burows on: 02/26/2024 07:49 AM   Modules accepted: Orders

## 2024-02-27 ENCOUNTER — Other Ambulatory Visit (HOSPITAL_BASED_OUTPATIENT_CLINIC_OR_DEPARTMENT_OTHER): Payer: Self-pay

## 2024-03-04 NOTE — Progress Notes (Signed)
 Cardiology Office Note:   Date:  03/10/2024  ID:  Russell [name], DOB 30-Sep-1976, MRN [id number] PCP:  Norberto Sorenson, NP  [facility] Providers Cardiologist:  Alverda Skeans, MD Referring MD: Norberto Sorenson, NP  Chief Complaint/Reason for Referral: Cardiology follow-up ASSESSMENT:    1. Idiopathic pericarditis, unspecified chronicity   2. Paroxysmal atrial fibrillation (HCC)   3. Secondary hypercoagulable state (HCC)   4. BMI 45.0-49.9, adult (HCC)   5. Deep vein thrombosis (DVT) of proximal lower extremity, unspecified chronicity, unspecified laterality (HCC)      PLAN:   In order of problems listed above: Pericarditis: The Russell [name] has a few more days before completing ibuprofen 600 mg 3 times daily for 14 days; continue colchicine 0.6 mg twice daily for total of 2 months.  Check CRP and echocardiogram in May after colchicine course is complete. Paroxysmal atrial fibrillation: Continue Xarelto 20 mg daily and Multaq 400 mg twice daily.   Secondary hypercoagulable state: Continue Xarelto 20 mg daily.   Elevated BMI: Check hemoglobin A1c to screen for diabetes and if positive will refer to pharmacy for GLP-1 receptor agonist therapy. Deep vein thrombosis: History of unprovoked DVT, continue Xarelto 20 mg daily indefinitely            Dispo:  No follow-ups on file.      Medication Adjustments/Labs and Tests Ordered: Current medicines are reviewed at length with the Russell [name] today.  Concerns regarding medicines are outlined above.  The following changes have been made:  no change   Labs/tests ordered: Orders Placed This Encounter  Procedures   HgB A1c    Medication Changes: No orders of the defined types were placed in this encounter.   Current medicines are reviewed at length with the Russell [name] today.  The Russell [name] does not have concerns regarding medicines.  I spent 34 minutes reviewing all clinical data during and prior to this visit including all relevant  imaging studies, laboratories, clinical information from other health systems, and prior notes from both Cardiology and other specialties, interviewing the Russell [name], and conducting a complete physical examination in order to formulate a comprehensive and personalized evaluation and treatment plan.  History of Present Illness:      FOCUSED PROBLEM LIST:   Atrial flutter Ablation 2021 Atrial fibrillation CV 2 score 0 Followed by atrial fibrillation clinic On Xarelto BMI 48 DVT April 2023 Unprovoked; on chronic Xarelto Pericardial calcification Chest CT 2021 TTE without findings suggestive of constriction; EF 50 to 55% 2023 CMR 2025 consistent with constriction and pericarditis >> ibruprofren + colchicine  May 2023: The Russell [name] is a 48 y.o. male with the indicated medical history here to establish general cardiology care.  The Russell [name] was seen by his primary care provider recently.  He had developed a DVT and was seen in the emergency department at an outside facility.  He had no inciting factors.  He was started on Xarelto.  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   He is tolerating Xarelto well.  He denies any significant shortness of breath, chest pain, palpitations, paroxysmal nocturnal dyspnea, orthopnea.  He is working to get fitted for CPAP though he cannot afford the co-pay right now.  He is required no recent emergency room visits or hospitalizations since being in the hospital for his DVT.  He is otherwise well without complaints.  Plan: Obtain echocardiogram; refer to pharmacy given elevated BMI.   May 2024: In the interim the Russell [name] was seen by pharmacy and started on Wegovy.  Unfortunately his insurance would not cover this and so this was stopped.  An echocardiogram  was done which demonstrated a thickened and calcified pericardium with no evidence of tamponade.  His ejection fraction was preserved.  He had no significant valvular abnormalities.  He was seen by his PCP and they elected to pursue indefinite anticoagulation due to his unprovoked DVT.  The Russell [name] is otherwise doing well.  He denies any shortness of breath, presyncope, syncope, paroxysmal nocturnal dyspnea, or palpitations.  He does have occasional dependent edema.  He fortunately has not required hospitalization.  Plan: Follow-up in 18 months.  November 2024: The Russell [name] is referred for cardiology opinion.  The Russell [name] developed right groin pain and bloody stools.  He underwent a CT abdomen pelvis which demonstrated bilateral inguinal lymphadenopathy and borderline hepatomegaly with a dilated IVC and reflux of contrast into the hepatic veins.  A CMP at that time demonstrated no elevation in LFTs.  He is referred for further recommendations.  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  He is able to do all of his activities of daily living without any issues.  Denies any severe shortness of breath, palpitations, or paroxysmal nocturnal dyspnea.  He does have chronic lower extremity edema with the right worse than the left and this has been present since his DVT.  He is scheduled to undergo an EGD in the coming weeks.  Plan: Obtain CMP and echocardiogram to evaluate abnormal liver function.  March 2025: Russell [name] consents to use of AI scribe. In the interim his CMP showed no abnormalities.  An echocardiogram demonstrated septal bounce and pericardial calcification and therefore a CMR was performed to evaluate further which was concerning for pericardial constriction and pericarditis.  CRP was ordered but never drawn.  He was started on ibuprofen 600 mg 3 times daily for 2 weeks, colchicine 0.6 mg twice daily for 3 months.  He was seen in atrial fibrillation clinic and was doing well.  He is here  for follow-up.  He completed a course of ibuprofen, missing two doses, and started it two days late due to financial constraints. He is currently on colchicine, which he has not missed.  He monitors his heart rhythm with a watch and notes being in atrial fibrillation most of the time. He experiences occasional palpitations, especially during exertion, but they are not severe. No chest pain or breathing difficulties. He has not experienced any bleeding while on Xarelto and has not had any hospitalizations or emergency room visits recently.  He denies experiencing chest discomfort with deep breaths. He has been eating better for 107 days and notes weight loss.         Current Medications: Current Meds  Medication Sig   Acetaminophen (TYLENOL PO) Take 3 tablets by mouth as needed. Extra strength Tylenol - 500mg    colchicine 0.6 MG tablet Take 1 tablet (0.6 mg total) by mouth 2 (two) times daily.   dronedarone (MULTAQ) 400 MG tablet Take 1 tablet (400 mg total) by mouth 2 (two) times daily with a meal.   ibuprofen (ADVIL) 600 MG tablet Take 1 tablet (600 mg total) by mouth 3 (three) times daily for 14 days.   Multiple Vitamin (ONE-A-DAY MENS PO) Take 1 tablet by mouth in the morning.   pantoprazole (  PROTONIX) 40 MG tablet Take 1 tablet (40 mg total) by mouth every morning before breakfast.   rivaroxaban (XARELTO) 20 MG TABS tablet Take 1 tablet (20 mg total) by mouth daily.   sertraline (ZOLOFT) 50 MG tablet Take 1.5 tablets (75 mg total) by mouth daily.     Review of Systems:   Please see the history of present illness.    All other systems reviewed and are negative.     EKGs/Labs/Other Test Reviewed:   EKG: EKG from February 2024 demonstrates atrial fibrillation  EKG Interpretation Date/Time:    Ventricular Rate:    PR Interval:    QRS Duration:    QT Interval:    QTC Calculation:   R Axis:      Text Interpretation:           Risk Assessment/Calculations:    CHA2DS2-VASc  Score = 2   This indicates a 2.2% annual risk of stroke. The Russell [name]'s score is based upon: CHF History: 1 HTN History: 0 Diabetes History: 0 Stroke History: 0 Vascular Disease History: 1 Age Score: 0 Gender Score: 0         Physical Exam:   VS:  BP 130/80   Pulse 68   Ht 5\' 8"  (1.727 m)   Wt 274 lb 12.8 oz (124.6 kg)   SpO2 97%   BMI 41.78 kg/m        Wt Readings from Last 3 Encounters:  03/10/24 274 lb 12.8 oz (124.6 kg)  02/23/24 279 lb 6.4 oz (126.7 kg)  11/10/23 (!) 324 lb (147 kg)      GENERAL:  No apparent distress, AOx3 HEENT:  No carotid bruits, +2 carotid impulses, no scleral icterus CAR: Irregular RR no murmurs, gallops, rubs, or thrills RES:  Clear to auscultation bilaterally ABD:  Soft, nontender, nondistended, positive bowel sounds x 4 VASC:  +2 radial pulses, +2 carotid pulses NEURO:  CN 2-12 grossly intact; motor and sensory grossly intact PSYCH:  No active depression or anxiety EXT: +3 edema lower extremity bilaterally, no ecchymosis, or cyanosis  Signed, [name], MD  03/10/2024 10:49 AM    [facility] [address] [place], [place], [place]  [address] Phone: [phone]; Fax: [phone]   Note:  This document was prepared using Dragon voice recognition software and may include unintentional dictation errors.

## 2024-03-04 NOTE — H&P (View-Only) (Signed)
 Cardiology Office Note:   Date:  03/10/2024  ID:  Russell [name], DOB 30-Sep-1976, MRN [id number] PCP:  Norberto Sorenson, NP  [facility] Providers Cardiologist:  Alverda Skeans, MD Referring MD: Norberto Sorenson, NP  Chief Complaint/Reason for Referral: Cardiology follow-up ASSESSMENT:    1. Idiopathic pericarditis, unspecified chronicity   2. Paroxysmal atrial fibrillation (HCC)   3. Secondary hypercoagulable state (HCC)   4. BMI 45.0-49.9, adult (HCC)   5. Deep vein thrombosis (DVT) of proximal lower extremity, unspecified chronicity, unspecified laterality (HCC)      PLAN:   In order of problems listed above: Pericarditis: The Russell [name] has a few more days before completing ibuprofen 600 mg 3 times daily for 14 days; continue colchicine 0.6 mg twice daily for total of 2 months.  Check CRP and echocardiogram in May after colchicine course is complete. Paroxysmal atrial fibrillation: Continue Xarelto 20 mg daily and Multaq 400 mg twice daily.   Secondary hypercoagulable state: Continue Xarelto 20 mg daily.   Elevated BMI: Check hemoglobin A1c to screen for diabetes and if positive will refer to pharmacy for GLP-1 receptor agonist therapy. Deep vein thrombosis: History of unprovoked DVT, continue Xarelto 20 mg daily indefinitely            Dispo:  No follow-ups on file.      Medication Adjustments/Labs and Tests Ordered: Current medicines are reviewed at length with the Russell [name] today.  Concerns regarding medicines are outlined above.  The following changes have been made:  no change   Labs/tests ordered: Orders Placed This Encounter  Procedures   HgB A1c    Medication Changes: No orders of the defined types were placed in this encounter.   Current medicines are reviewed at length with the Russell [name] today.  The Russell [name] does not have concerns regarding medicines.  I spent 34 minutes reviewing all clinical data during and prior to this visit including all relevant  imaging studies, laboratories, clinical information from other health systems, and prior notes from both Cardiology and other specialties, interviewing the Russell [name], and conducting a complete physical examination in order to formulate a comprehensive and personalized evaluation and treatment plan.  History of Present Illness:      FOCUSED PROBLEM LIST:   Atrial flutter Ablation 2021 Atrial fibrillation CV 2 score 0 Followed by atrial fibrillation clinic On Xarelto BMI 48 DVT April 2023 Unprovoked; on chronic Xarelto Pericardial calcification Chest CT 2021 TTE without findings suggestive of constriction; EF 50 to 55% 2023 CMR 2025 consistent with constriction and pericarditis >> ibruprofren + colchicine  May 2023: The Russell [name] is a 48 y.o. male with the indicated medical history here to establish general cardiology care.  The Russell [name] was seen by his primary care provider recently.  He had developed a DVT and was seen in the emergency department at an outside facility.  He had no inciting factors.  He was started on Xarelto.  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   He is tolerating Xarelto well.  He denies any significant shortness of breath, chest pain, palpitations, paroxysmal nocturnal dyspnea, orthopnea.  He is working to get fitted for CPAP though he cannot afford the co-pay right now.  He is required no recent emergency room visits or hospitalizations since being in the hospital for his DVT.  He is otherwise well without complaints.  Plan: Obtain echocardiogram; refer to pharmacy given elevated BMI.   May 2024: In the interim the Russell [name] was seen by pharmacy and started on Wegovy.  Unfortunately his insurance would not cover this and so this was stopped.  An echocardiogram  was done which demonstrated a thickened and calcified pericardium with no evidence of tamponade.  His ejection fraction was preserved.  He had no significant valvular abnormalities.  He was seen by his PCP and they elected to pursue indefinite anticoagulation due to his unprovoked DVT.  The Russell [name] is otherwise doing well.  He denies any shortness of breath, presyncope, syncope, paroxysmal nocturnal dyspnea, or palpitations.  He does have occasional dependent edema.  He fortunately has not required hospitalization.  Plan: Follow-up in 18 months.  November 2024: The Russell [name] is referred for cardiology opinion.  The Russell [name] developed right groin pain and bloody stools.  He underwent a CT abdomen pelvis which demonstrated bilateral inguinal lymphadenopathy and borderline hepatomegaly with a dilated IVC and reflux of contrast into the hepatic veins.  A CMP at that time demonstrated no elevation in LFTs.  He is referred for further recommendations.  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  He is able to do all of his activities of daily living without any issues.  Denies any severe shortness of breath, palpitations, or paroxysmal nocturnal dyspnea.  He does have chronic lower extremity edema with the right worse than the left and this has been present since his DVT.  He is scheduled to undergo an EGD in the coming weeks.  Plan: Obtain CMP and echocardiogram to evaluate abnormal liver function.  March 2025: Russell [name] consents to use of AI scribe. In the interim his CMP showed no abnormalities.  An echocardiogram demonstrated septal bounce and pericardial calcification and therefore a CMR was performed to evaluate further which was concerning for pericardial constriction and pericarditis.  CRP was ordered but never drawn.  He was started on ibuprofen 600 mg 3 times daily for 2 weeks, colchicine 0.6 mg twice daily for 3 months.  He was seen in atrial fibrillation clinic and was doing well.  He is here  for follow-up.  He completed a course of ibuprofen, missing two doses, and started it two days late due to financial constraints. He is currently on colchicine, which he has not missed.  He monitors his heart rhythm with a watch and notes being in atrial fibrillation most of the time. He experiences occasional palpitations, especially during exertion, but they are not severe. No chest pain or breathing difficulties. He has not experienced any bleeding while on Xarelto and has not had any hospitalizations or emergency room visits recently.  He denies experiencing chest discomfort with deep breaths. He has been eating better for 107 days and notes weight loss.         Current Medications: Current Meds  Medication Sig   Acetaminophen (TYLENOL PO) Take 3 tablets by mouth as needed. Extra strength Tylenol - 500mg    colchicine 0.6 MG tablet Take 1 tablet (0.6 mg total) by mouth 2 (two) times daily.   dronedarone (MULTAQ) 400 MG tablet Take 1 tablet (400 mg total) by mouth 2 (two) times daily with a meal.   ibuprofen (ADVIL) 600 MG tablet Take 1 tablet (600 mg total) by mouth 3 (three) times daily for 14 days.   Multiple Vitamin (ONE-A-DAY MENS PO) Take 1 tablet by mouth in the morning.   pantoprazole (  PROTONIX) 40 MG tablet Take 1 tablet (40 mg total) by mouth every morning before breakfast.   rivaroxaban (XARELTO) 20 MG TABS tablet Take 1 tablet (20 mg total) by mouth daily.   sertraline (ZOLOFT) 50 MG tablet Take 1.5 tablets (75 mg total) by mouth daily.     Review of Systems:   Please see the history of present illness.    All other systems reviewed and are negative.     EKGs/Labs/Other Test Reviewed:   EKG: EKG from February 2024 demonstrates atrial fibrillation  EKG Interpretation Date/Time:    Ventricular Rate:    PR Interval:    QRS Duration:    QT Interval:    QTC Calculation:   R Axis:      Text Interpretation:           Risk Assessment/Calculations:    CHA2DS2-VASc  Score = 2   This indicates a 2.2% annual risk of stroke. The Russell [name]'s score is based upon: CHF History: 1 HTN History: 0 Diabetes History: 0 Stroke History: 0 Vascular Disease History: 1 Age Score: 0 Gender Score: 0         Physical Exam:   VS:  BP 130/80   Pulse 68   Ht 5\' 8"  (1.727 m)   Wt 274 lb 12.8 oz (124.6 kg)   SpO2 97%   BMI 41.78 kg/m        Wt Readings from Last 3 Encounters:  03/10/24 274 lb 12.8 oz (124.6 kg)  02/23/24 279 lb 6.4 oz (126.7 kg)  11/10/23 (!) 324 lb (147 kg)      GENERAL:  No apparent distress, AOx3 HEENT:  No carotid bruits, +2 carotid impulses, no scleral icterus CAR: Irregular RR no murmurs, gallops, rubs, or thrills RES:  Clear to auscultation bilaterally ABD:  Soft, nontender, nondistended, positive bowel sounds x 4 VASC:  +2 radial pulses, +2 carotid pulses NEURO:  CN 2-12 grossly intact; motor and sensory grossly intact PSYCH:  No active depression or anxiety EXT: +3 edema lower extremity bilaterally, no ecchymosis, or cyanosis  Signed, [name], MD  03/10/2024 10:49 AM    [facility] [address] [place], [place], [place]  [address] Phone: [phone]; Fax: [phone]   Note:  This document was prepared using Dragon voice recognition software and may include unintentional dictation errors.

## 2024-03-10 ENCOUNTER — Encounter: Payer: Self-pay | Admitting: Internal Medicine

## 2024-03-10 ENCOUNTER — Ambulatory Visit: Payer: BC Managed Care – PPO | Attending: Internal Medicine | Admitting: Internal Medicine

## 2024-03-10 VITALS — BP 130/80 | HR 68 | Ht 68.0 in | Wt 274.8 lb

## 2024-03-10 DIAGNOSIS — D6869 Other thrombophilia: Secondary | ICD-10-CM

## 2024-03-10 DIAGNOSIS — Z6841 Body Mass Index (BMI) 40.0 and over, adult: Secondary | ICD-10-CM

## 2024-03-10 DIAGNOSIS — I480 Paroxysmal atrial fibrillation: Secondary | ICD-10-CM | POA: Diagnosis not present

## 2024-03-10 DIAGNOSIS — I300 Acute nonspecific idiopathic pericarditis: Secondary | ICD-10-CM

## 2024-03-10 DIAGNOSIS — I824Y9 Acute embolism and thrombosis of unspecified deep veins of unspecified proximal lower extremity: Secondary | ICD-10-CM

## 2024-03-10 NOTE — Patient Instructions (Signed)
 Medication Instructions:  No changes *If you need a refill on your cardiac medications before your next appointment, please call your pharmacy*   Lab Work: Today: [facility] for hgA1c   Testing/Procedures: none   Follow-Up: 2 months w Dr. Lynnette Caffey  Other Instructions

## 2024-03-11 ENCOUNTER — Encounter: Payer: Self-pay | Admitting: Internal Medicine

## 2024-03-11 LAB — HEMOGLOBIN A1C
Est. average glucose Bld gHb Est-mCnc: 117 mg/dL
Hgb A1c MFr Bld: 5.7 % — ABNORMAL HIGH (ref 4.8–5.6)

## 2024-03-12 ENCOUNTER — Telehealth (HOSPITAL_COMMUNITY): Payer: Self-pay

## 2024-03-12 ENCOUNTER — Other Ambulatory Visit: Payer: Self-pay

## 2024-03-12 ENCOUNTER — Other Ambulatory Visit (HOSPITAL_BASED_OUTPATIENT_CLINIC_OR_DEPARTMENT_OTHER): Payer: Self-pay

## 2024-03-12 ENCOUNTER — Other Ambulatory Visit (HOSPITAL_COMMUNITY): Payer: Self-pay | Admitting: Physician Assistant

## 2024-03-12 MED ORDER — MULTAQ 400 MG PO TABS
400.0000 mg | ORAL_TABLET | Freq: Two times a day (BID) | ORAL | 3 refills | Status: DC
Start: 2024-03-12 — End: 2024-06-28
  Filled 2024-03-12: qty 60, 30d supply, fill #0
  Filled 2024-04-02 – 2024-04-05 (×2): qty 60, 30d supply, fill #1
  Filled 2024-05-11: qty 60, 30d supply, fill #2
  Filled 2024-06-17: qty 60, 30d supply, fill #3

## 2024-03-12 NOTE — Telephone Encounter (Signed)
 Cardioversion scheduled for: March 18th 2025   - Arrive at the [facility] and go to admitting at 8:30 am   - Do not eat or drink anything after midnight the night prior to your procedure.   - Take all your morning medication (except diabetic medications) with a sip of water prior to arrival.  - You will not be able to drive home after your procedure.    - Do NOT miss any doses of your blood thinner - if you should miss a dose please notify our office immediately.   - If you feel as if you go back into normal rhythm prior to scheduled cardioversion, please notify our office immediately.   If your procedure is canceled in the cardioversion suite you will be charged a cancellation fee.      For those patients who have a scheduled procedure/anesthesia on the same day of the week as their dose, hold the medication on the day of surgery.  They can take their scheduled dose the week before.  **Patients on the above medications scheduled for elective procedures that have not held the medication for the appropriate amount of time are at risk of cancellation or change in the anesthetic plan.

## 2024-03-15 ENCOUNTER — Other Ambulatory Visit (HOSPITAL_COMMUNITY): Payer: Self-pay | Admitting: *Deleted

## 2024-03-15 ENCOUNTER — Ambulatory Visit (HOSPITAL_COMMUNITY)
Admission: RE | Admit: 2024-03-15 | Discharge: 2024-03-15 | Disposition: A | Discharge status: Home / Self Care | Source: Ambulatory Visit | Attending: Physician Assistant | Admitting: Physician Assistant

## 2024-03-15 DIAGNOSIS — D6869 Other thrombophilia: Secondary | ICD-10-CM | POA: Insufficient documentation

## 2024-03-15 DIAGNOSIS — I4819 Other persistent atrial fibrillation: Secondary | ICD-10-CM

## 2024-03-15 LAB — CBC
HCT: 39.6 % (ref 39.0–52.0)
Hemoglobin: 13.1 g/dL (ref 13.0–17.0)
MCH: 32.9 pg (ref 26.0–34.0)
MCHC: 33.1 g/dL (ref 30.0–36.0)
MCV: 99.5 fL (ref 80.0–100.0)
Platelets: 110 10*3/uL — ABNORMAL LOW (ref 150–400)
RBC: 3.98 MIL/uL — ABNORMAL LOW (ref 4.22–5.81)
RDW: 14.7 % (ref 11.5–15.5)
WBC: 5 10*3/uL (ref 4.0–10.5)
nRBC: 0 % (ref 0.0–0.2)

## 2024-03-15 NOTE — Progress Notes (Signed)
 Called patient with pre-procedure instructions for tomorrow.   Patient informed of:   Time to arrive for procedure. 0800 Remain NPO past midnight.  Must have a ride home and a responsible adult to remain with them for 24 hours post procedure.  Confirmed blood thinner. Xarelto Confirmed no breaks in taking blood thinner for 3+ weeks prior to procedure. Confirmed patient stopped all GLP-1s and GLP-2s for at least one week before procedure.

## 2024-03-16 ENCOUNTER — Ambulatory Visit (HOSPITAL_COMMUNITY): Admitting: Anesthesiology

## 2024-03-16 ENCOUNTER — Encounter (HOSPITAL_COMMUNITY): Payer: Self-pay | Admitting: Cardiology

## 2024-03-16 ENCOUNTER — Ambulatory Visit (HOSPITAL_COMMUNITY)
Admission: RE | Admit: 2024-03-16 | Discharge: 2024-03-16 | Disposition: A | Discharge status: Home / Self Care | Attending: Cardiology | Admitting: Cardiology

## 2024-03-16 ENCOUNTER — Other Ambulatory Visit: Payer: Self-pay

## 2024-03-16 ENCOUNTER — Encounter (HOSPITAL_COMMUNITY)
Admission: RE | Disposition: A | Discharge status: Home / Self Care | Payer: Self-pay | Source: Home / Self Care | Attending: Cardiology

## 2024-03-16 DIAGNOSIS — Z7901 Long term (current) use of anticoagulants: Secondary | ICD-10-CM | POA: Insufficient documentation

## 2024-03-16 DIAGNOSIS — Z86718 Personal history of other venous thrombosis and embolism: Secondary | ICD-10-CM | POA: Insufficient documentation

## 2024-03-16 DIAGNOSIS — I509 Heart failure, unspecified: Secondary | ICD-10-CM | POA: Diagnosis not present

## 2024-03-16 DIAGNOSIS — D6869 Other thrombophilia: Secondary | ICD-10-CM | POA: Diagnosis not present

## 2024-03-16 DIAGNOSIS — Z79899 Other long term (current) drug therapy: Secondary | ICD-10-CM | POA: Insufficient documentation

## 2024-03-16 DIAGNOSIS — I300 Acute nonspecific idiopathic pericarditis: Secondary | ICD-10-CM | POA: Diagnosis not present

## 2024-03-16 DIAGNOSIS — Z6841 Body Mass Index (BMI) 40.0 and over, adult: Secondary | ICD-10-CM | POA: Diagnosis not present

## 2024-03-16 DIAGNOSIS — I4819 Other persistent atrial fibrillation: Secondary | ICD-10-CM | POA: Diagnosis not present

## 2024-03-16 DIAGNOSIS — R1110 Vomiting, unspecified: Secondary | ICD-10-CM | POA: Diagnosis not present

## 2024-03-16 DIAGNOSIS — E66813 Obesity, class 3: Secondary | ICD-10-CM | POA: Insufficient documentation

## 2024-03-16 DIAGNOSIS — G4733 Obstructive sleep apnea (adult) (pediatric): Secondary | ICD-10-CM | POA: Diagnosis not present

## 2024-03-16 DIAGNOSIS — R112 Nausea with vomiting, unspecified: Secondary | ICD-10-CM | POA: Diagnosis not present

## 2024-03-16 HISTORY — PX: CARDIOVERSION: EP1203

## 2024-03-16 SURGERY — CARDIOVERSION (CATH LAB)
Anesthesia: General

## 2024-03-16 MED ORDER — SODIUM CHLORIDE 0.9% FLUSH: 3.0000 mL | Freq: Two times a day (BID) | INTRAVENOUS | Status: DC

## 2024-03-16 MED ORDER — SODIUM CHLORIDE 0.9% FLUSH
3.0000 mL | INTRAVENOUS | Status: DC | PRN
Start: 2024-03-16 — End: 2024-03-16

## 2024-03-16 MED ORDER — PROPOFOL 10 MG/ML IV BOLUS
INTRAVENOUS | Status: DC | PRN
  Administered 2024-03-16: 80 mg via INTRAVENOUS

## 2024-03-16 SURGICAL SUPPLY — 1 items: PAD DEFIB RADIO PHYSIO CONN (PAD) ×1 IMPLANT

## 2024-03-16 NOTE — Transfer of Care (Signed)
 Immediate Anesthesia Transfer of Care Note  Patient: Russell [name]  Procedure(s) Performed: CARDIOVERSION  Patient Location: Cath Lab  Anesthesia Type:General  Level of Consciousness: awake, alert , and oriented  Airway & Oxygen Therapy: Patient Spontanous Breathing and Patient connected to nasal cannula oxygen  Post-op Assessment: Report given to RN, Post -op Vital signs reviewed and stable, and Patient moving all extremities X 4  Post vital signs: Reviewed and stable  Last Vitals:  Vitals Value Taken Time  BP    Temp    Pulse 72 03/16/24 0837  Resp 17 03/16/24 0837  SpO2 98 % 03/16/24 0837  Vitals shown include unfiled device data.  Last Pain:  Vitals:   03/16/24 0738  TempSrc: Temporal         Complications: No notable events documented.

## 2024-03-16 NOTE — CV Procedure (Signed)
   Electrical Cardioversion Procedure Note Russell [name] [id number] February 22, 1976  Procedure: Electrical Cardioversion Indications:  Atrial Fibrillation  Time Out: Verified patient identification, verified procedure,medications/allergies/relevent history reviewed, required imaging and test results available.  Performed  Procedure Details  The patient signed informed consent.   The patient was NPO past midnight. Has had therapeutic anticoagulation with Xeralto greater than 3 weeks. The patient denies any interruption of anticoagulation.  Anesthesia was administered by Dr. Maple Hudson.  Adequate airway was maintained throughout and vital followed per protocol.  He was cardioverted x 1 with 200J of biphasic synchronized energy.  He converted to NSR.  There were no apparent complications.  The patient tolerated the procedure well and had normal neuro status and respiratory status post procedure with vitals stable as recorded elsewhere.     IMPRESSION:  Successful cardioversion of atrial fibrillation   Follow up:  We will arrange follow up with Primary cardiologist.  He will continue on current medical therapy.  The patient advised to continue anticoagulation.  Russell [name] 03/16/2024, 8:54 AM

## 2024-03-16 NOTE — Interval H&P Note (Signed)
 History and Physical Interval Note:  03/16/2024 7:47 AM  [name]  has presented today for surgery, with the diagnosis of afib.  The various methods of treatment have been discussed with the patient and family. After consideration of risks, benefits and other options for treatment, the patient has consented to  Procedure(s): CARDIOVERSION (N/A) as a surgical intervention.  The patient's history has been reviewed, patient examined, no change in status, stable for surgery.  I have reviewed the patient's chart and labs.  Questions were answered to the patient's satisfaction.     Russell [name]

## 2024-03-16 NOTE — Anesthesia Procedure Notes (Signed)
 Procedure Name: General with mask airway Date/Time: 03/16/2024 8:41 AM  Performed by: Marena Chancy, CRNAPre-anesthesia Checklist: Patient identified, Emergency Drugs available, Timeout performed, Suction available and Patient being monitored Patient Re-evaluated:Patient Re-evaluated prior to induction Oxygen Delivery Method: Nasal cannula Preoxygenation: Pre-oxygenation with 100% oxygen Induction Type: IV induction

## 2024-03-16 NOTE — Anesthesia Postprocedure Evaluation (Signed)
 Anesthesia Post Note  Patient: Russell [name]  Procedure(s) Performed: CARDIOVERSION     Patient location during evaluation: Cath Lab Anesthesia Type: General Level of consciousness: awake and alert Pain management: pain level controlled Vital Signs Assessment: post-procedure vital signs reviewed and stable Respiratory status: spontaneous breathing, nonlabored ventilation and respiratory function stable Cardiovascular status: blood pressure returned to baseline and stable Postop Assessment: no apparent nausea or vomiting Anesthetic complications: no   No notable events documented.  Last Vitals:  Vitals:   03/16/24 0738  BP: 126/89  Pulse: 85  Resp: 14  Temp: 36.5 C  SpO2: 98%    Last Pain:  Vitals:   03/16/24 0738  TempSrc: Temporal                 Melia Hopes

## 2024-03-16 NOTE — Anesthesia Preprocedure Evaluation (Signed)
 Anesthesia Evaluation  Patient identified by MRN, date of birth, ID band Patient awake    Reviewed: Allergy & Precautions, NPO status , Patient's Chart, lab work & pertinent test results  History of Anesthesia Complications Negative for: history of anesthetic complications  Airway Mallampati: III  TM Distance: >3 FB Neck ROM: Full    Dental  (+) Dental Advisory Given,    Pulmonary neg shortness of breath, sleep apnea , neg COPD, neg recent URI, Patient abstained from smoking., former smoker   breath sounds clear to auscultation       Cardiovascular +CHF  + dysrhythmias Atrial Fibrillation  Rhythm:Irregular   1. Left ventricular ejection fraction, by estimation, is 40 to 45%. Left  ventricular ejection fraction by 2D MOD biplane is 42.8 %. The left  ventricle has mildly decreased function. The left ventricle demonstrates  global hypokinesis. Left ventricular  diastolic function could not be evaluated.   2. Right ventricular systolic function is moderately reduced. The right  ventricular size is normal. There is normal pulmonary artery systolic  pressure.   3. The mitral valve is grossly normal. No evidence of mitral valve  regurgitation.   4. The aortic valve was not well visualized. Aortic valve regurgitation  is not visualized.   5. The inferior vena cava is dilated in size with <50% respiratory  variability, suggesting right atrial pressure of 15 mmHg.     Neuro/Psych  PSYCHIATRIC DISORDERS  Depression     Neuromuscular disease    GI/Hepatic negative GI ROS, Neg liver ROS,,,  Endo/Other    Class 3 obesity (BMI 48)  Renal/GU negative Renal ROSLab Results      Component                Value               Date                      NA                       138                 02/23/2024                K                        3.6                 02/23/2024                CO2                      21 (L)               02/23/2024                GLUCOSE                  83                  02/23/2024                BUN                      18                  02/23/2024  CREATININE               0.75                02/23/2024                CALCIUM                  9.4                 02/23/2024                EGFR                     92                  11/10/2023                GFRNONAA                 >60                 02/23/2024                Musculoskeletal negative musculoskeletal ROS (+)    Abdominal   Peds  Hematology  (+) Blood dyscrasia (on xarelto) Lab Results      Component                Value               Date                      WBC                      5.0                 03/15/2024                HGB                      13.1                03/15/2024                HCT                      39.6                03/15/2024                MCV                      99.5                03/15/2024                PLT                      110 (L)             03/15/2024            xarelto   Anesthesia Other Findings   Reproductive/Obstetrics                              Anesthesia Physical Anesthesia Plan  ASA: 3  Anesthesia Plan: General   Post-op  Pain Management:    Induction: Intravenous  PONV Risk Score and Plan: 2 and Treatment may vary due to age or medical condition  Airway Management Planned: Nasal Cannula, Simple Face Mask and Natural Airway  Additional Equipment: None  Intra-op Plan:   Post-operative Plan:   Informed Consent: I have reviewed the patients History and Physical, chart, labs and discussed the procedure including the risks, benefits and alternatives for the proposed anesthesia with the patient or authorized representative who has indicated his/her understanding and acceptance.       Plan Discussed with: CRNA  Anesthesia Plan Comments:          Anesthesia Quick Evaluation

## 2024-03-17 ENCOUNTER — Other Ambulatory Visit: Payer: Self-pay

## 2024-03-17 ENCOUNTER — Other Ambulatory Visit (HOSPITAL_BASED_OUTPATIENT_CLINIC_OR_DEPARTMENT_OTHER): Payer: Self-pay

## 2024-03-17 DIAGNOSIS — I491 Atrial premature depolarization: Secondary | ICD-10-CM | POA: Diagnosis not present

## 2024-03-17 MED ORDER — ONDANSETRON 4 MG PO TBDP
4.0000 mg | ORAL_TABLET | Freq: Three times a day (TID) | ORAL | 0 refills | Status: DC | PRN
  Filled 2024-03-17 (×2): qty 15, 5d supply, fill #0

## 2024-03-22 ENCOUNTER — Telehealth: Payer: Self-pay | Admitting: Internal Medicine

## 2024-03-22 MED ORDER — COLCHICINE 0.6 MG PO TABS: 0.6000 mg | ORAL_TABLET | Freq: Every day | ORAL | Status: DC

## 2024-03-22 NOTE — Telephone Encounter (Signed)
 Call placed to the pt about this encounter.   See telenote from today for further details about this matter.

## 2024-03-22 NOTE — Telephone Encounter (Signed)
 Pt c/o medication issue:  1. Name of Medication:   colchicine 0.6 MG tablet   2. How are you currently taking this medication (dosage and times per day)?   As prescribed  3. Are you having a reaction (difficulty breathing--STAT)?     4. What is your medication issue?   Patient stated after his cardioversion he thought he had a stomach bug but this has been going on for a week.  Patient noticed symptoms after taking this medication. Patient stated he also sent MyChart message.

## 2024-03-22 NOTE — Telephone Encounter (Signed)
 Called patient and informed of recommendation by PharmD.  He will decrease colchicine to daily and let us know if no improvement.  Medication list updated.

## 2024-03-22 NOTE — Telephone Encounter (Signed)
 Would recommend that he decrease to once a day. If no improvement, let us know.

## 2024-03-22 NOTE — Telephone Encounter (Signed)
 Message below is copied from a mychart message the pt sent earlier this morning about these complaints:  Russell [name], Russell [name] [address]  [address] Triage Phone Number: [phone]   Good morning Dr. Peggye Form noticed a big change since my cardio version as far as the colchicine meds go that same day I started getting nauseous I thought it was a bug but it's been a week and I've lost about 5 pounds vomiting and diarrhea I was wondering is there any alternatives or do I need to try and power through this I know my stomach feels nasty and I need to eat anyway thank you for your time have a good day sir.   Returned a call back to the pt about this and he states that the colchicine is causing his significant GI upset, like nausea, some vomiting, and diarrhea.   He states he has taken this on both empty stomach and with food, and this doesn't make a difference for him.  He said he read that colchicine does have this common side effect listed.  He states other than the GI upset, he is doing well from a cardiac standpoint.  Pt would like for Dr. Lynnette Caffey to further advise on alternative regimen to help with this.   Pt is aware that I will forward this message to Dr. Lynnette Caffey and his RN for further review and advisement.  I will also cc PharmD team in on this, to get their input as well.   Pt is aware that Dr. Trula Ore RN will follow-up with him accordingly thereafter, once advisement received.  Pt verbalized understanding and agrees with this plan.

## 2024-03-30 ENCOUNTER — Ambulatory Visit (HOSPITAL_COMMUNITY): Admitting: Physician Assistant

## 2024-03-30 DIAGNOSIS — K7460 Unspecified cirrhosis of liver: Secondary | ICD-10-CM | POA: Diagnosis not present

## 2024-03-30 DIAGNOSIS — R112 Nausea with vomiting, unspecified: Secondary | ICD-10-CM | POA: Diagnosis not present

## 2024-03-30 DIAGNOSIS — I8510 Secondary esophageal varices without bleeding: Secondary | ICD-10-CM | POA: Diagnosis not present

## 2024-03-30 DIAGNOSIS — K760 Fatty (change of) liver, not elsewhere classified: Secondary | ICD-10-CM | POA: Diagnosis not present

## 2024-04-02 ENCOUNTER — Other Ambulatory Visit (HOSPITAL_BASED_OUTPATIENT_CLINIC_OR_DEPARTMENT_OTHER): Payer: Self-pay

## 2024-04-02 MED ORDER — XARELTO 20 MG PO TABS
20.0000 mg | ORAL_TABLET | Freq: Every day | ORAL | 2 refills | Status: AC
  Filled 2024-04-02: qty 30, 30d supply, fill #0
  Filled 2024-04-27: qty 30, 30d supply, fill #1
  Filled 2024-05-31: qty 30, 30d supply, fill #2

## 2024-04-06 ENCOUNTER — Ambulatory Visit (HOSPITAL_COMMUNITY)
Admission: RE | Admit: 2024-04-06 | Discharge: 2024-04-06 | Disposition: A | Discharge status: Home / Self Care | Source: Ambulatory Visit | Attending: Physician Assistant | Admitting: Physician Assistant

## 2024-04-06 VITALS — BP 104/72 | HR 58 | Ht 68.0 in | Wt 273.8 lb

## 2024-04-06 DIAGNOSIS — I4892 Unspecified atrial flutter: Secondary | ICD-10-CM | POA: Diagnosis not present

## 2024-04-06 DIAGNOSIS — E669 Obesity, unspecified: Secondary | ICD-10-CM | POA: Insufficient documentation

## 2024-04-06 DIAGNOSIS — Z6841 Body Mass Index (BMI) 40.0 and over, adult: Secondary | ICD-10-CM | POA: Diagnosis not present

## 2024-04-06 DIAGNOSIS — Z7901 Long term (current) use of anticoagulants: Secondary | ICD-10-CM | POA: Insufficient documentation

## 2024-04-06 DIAGNOSIS — Z5971 Insufficient health insurance coverage: Secondary | ICD-10-CM | POA: Insufficient documentation

## 2024-04-06 DIAGNOSIS — I483 Typical atrial flutter: Secondary | ICD-10-CM | POA: Diagnosis not present

## 2024-04-06 DIAGNOSIS — I4819 Other persistent atrial fibrillation: Secondary | ICD-10-CM | POA: Insufficient documentation

## 2024-04-06 DIAGNOSIS — Z79899 Other long term (current) drug therapy: Secondary | ICD-10-CM | POA: Insufficient documentation

## 2024-04-06 DIAGNOSIS — G4733 Obstructive sleep apnea (adult) (pediatric): Secondary | ICD-10-CM | POA: Diagnosis not present

## 2024-04-06 DIAGNOSIS — Z9119 Patient's noncompliance with other medical treatment and regimen due to financial hardship: Secondary | ICD-10-CM | POA: Diagnosis not present

## 2024-04-06 DIAGNOSIS — I5020 Unspecified systolic (congestive) heart failure: Secondary | ICD-10-CM | POA: Insufficient documentation

## 2024-04-06 DIAGNOSIS — Z5181 Encounter for therapeutic drug level monitoring: Secondary | ICD-10-CM | POA: Diagnosis not present

## 2024-04-06 DIAGNOSIS — I4891 Unspecified atrial fibrillation: Secondary | ICD-10-CM | POA: Diagnosis not present

## 2024-04-06 DIAGNOSIS — Z86718 Personal history of other venous thrombosis and embolism: Secondary | ICD-10-CM | POA: Diagnosis not present

## 2024-04-06 NOTE — Progress Notes (Signed)
 Primary Care Physician: Norberto Sorenson, NP Primary Cardiologist: Dr Lynnette Caffey Primary Electrophysiologist: Dr Ladona Ridgel Referring Physician: [facility]   Russell [name] is a 48 y.o. male with a history of atrial flutter, atrial fibrillation, DVT, OSA who presents for follow up in the [facility].  The patient was initially diagnosed with atrial flutter in [place] 08/2017 and has had numerous cardioversions. Caffeine and Sprite appear to have been triggers for him. He has been on amiodarone in the past. Patient is not on long term anticoagulation with a CHADS2VASC score of 0. Patient has not followed up after his previous cardoversions due to a lack of insurance. He was seen in the ER on 03/02/20 with symptoms of palpitations and underwent successful DCCV. He was started on Xarelto. He denies any history of afib. He denies significant alcohol use. He has OSA but has not been on CPAP 2/2 cost.  Patient is s/p atrial flutter ablation with Dr Ladona Ridgel on 04/17/20. He has subsequently developed atrial fibrillation and underwent DCCV in the ED 08/15/20. He felt he was back in afib with symptoms of palpitations and SOB on 08/17/20. He presented to the ED in rate controlled afib. He spontaneously converted in the waiting room.  Patient returns for follow up for atrial fibrillation and Multaq monitoring. He is s/p DCCV 03/16/24. He is in SR today and feels improved with more energy. No bleeding issues on anticoagulation.   Today, he  denies symptoms of palpitations, chest pain, shortness of breath, orthopnea, PND, lower extremity edema, dizziness, presyncope, syncope,  bleeding, or neurologic sequela. The patient is tolerating medications without difficulties and is otherwise without complaint today.    Atrial Fibrillation Risk Factors:  he does have symptoms or diagnosis of sleep apnea. he does not have a history of rheumatic fever. he does have a history of alcohol use. The  patient does not have a history of early familial atrial fibrillation or other arrhythmias.   Atrial Fibrillation Management history:  Previous antiarrhythmic drugs: amiodarone, Multaq Previous cardioversions: several, most recently 03/02/20, 08/15/20, 03/16/24 Previous ablations: none Anticoagulation history: Xarelto   Past Medical History:  Diagnosis Date   Atrial fibrillation (HCC)    Atrial flutter (HCC)    Cellulitis     Current Outpatient Medications  Medication Sig Dispense Refill   colchicine 0.6 MG tablet Take 1 tablet (0.6 mg total) by mouth daily.     dronedarone (MULTAQ) 400 MG tablet Take 1 tablet (400 mg total) by mouth 2 (two) times daily with a meal. 60 tablet 3   Multiple Vitamin (ONE-A-DAY MENS PO) Take 1 tablet by mouth in the morning.     Omega-3 Fatty Acids (FISH OIL PO) Take 1 capsule by mouth daily.     pantoprazole (PROTONIX) 40 MG tablet Take 1 tablet (40 mg total) by mouth every morning before breakfast. 30 tablet 2   rivaroxaban (XARELTO) 20 MG TABS tablet Take 1 tablet (20 mg total) by mouth daily. 30 tablet 2   sertraline (ZOLOFT) 50 MG tablet Take 1.5 tablets (75 mg total) by mouth daily. 135 tablet 1   ondansetron (ZOFRAN-ODT) 4 MG disintegrating tablet Take 1 tablet (4 mg total) by mouth every 8 (eight) hours as needed for nausea or vomiting. (Patient not taking: Reported on 04/06/2024) 15 tablet 0   No current facility-administered medications for this encounter.    ROS- All systems are reviewed and negative except as per the HPI above.  Physical Exam: Vitals:  04/06/24 0913  BP: 104/72  Pulse: (!) 58  Weight: 124.2 kg  Height: 5\' 8"  (1.727 m)    GEN: Well nourished, well developed in no acute distress CARDIAC: Regular rate and rhythm, no murmurs, rubs, gallops RESPIRATORY:  Clear to auscultation without rales, wheezing or rhonchi  ABDOMEN: Soft, non-tender, non-distended EXTREMITIES:  No edema; No deformity    Wt Readings from Last 3  Encounters:  04/06/24 124.2 kg  03/10/24 124.6 kg  02/23/24 126.7 kg    EKG today demonstrates  SB, NST Vent. rate 58 BPM PR interval 170 ms QRS duration 82 ms QT/QTcB 456/447 ms   Echo 12/19/23 demonstrates  1. Left ventricular ejection fraction, by estimation, is 40 to 45%. Left  ventricular ejection fraction by 2D MOD biplane is 42.8 %. The left  ventricle has mildly decreased function. The left ventricle demonstrates  global hypokinesis. Left ventricular diastolic function could not be evaluated.   2. Right ventricular systolic function is moderately reduced. The right  ventricular size is normal. There is normal pulmonary artery systolic  pressure.   3. The mitral valve is grossly normal. No evidence of mitral valve  regurgitation.   4. The aortic valve was not well visualized. Aortic valve regurgitation  is not visualized.   5. The inferior vena cava is dilated in size with <50% respiratory  variability, suggesting right atrial pressure of 15 mmHg.   Comparison(s): Changes from prior study are noted. 06/03/2022: LVEF 50-55%.    Epic records are reviewed at length today  CHA2DS2-VASc Score = 1  The patient's score is based upon: CHF History: 1 HTN History: 0 Diabetes History: 0 Stroke History: 0 Vascular Disease History: 0 Age Score: 0 Gender Score: 0       ASSESSMENT AND PLAN: Persistent Atrial Fibrillation/typical atrial flutter The patient's CHA2DS2-VASc score is 1, indicating a 0.6% annual risk of stroke.   S/p atrial flutter ablation 04/17/20 S/p DCCV 03/16/24 Patient appears to be maintaining SR. If he has quick return of afib, could consider changing AAD to dofetilide. With his significant weight loss, he may also be a candidate for afib ablation.  Continue Multaq 400 mg BID Continue Xarelto 20 mg daily (h/o unprovoked DVT)  High Risk Medication Monitoring (ICD 10: Z79.899) Intervals on ECG acceptable for dronedarone monitoring.      Obesity Body  mass index is 41.63 kg/m.  Encouraged lifestyle modification  OSA  Patient not currently on CPAP  HFmrEF EF 40-45% on echo 48% on MRI OK to continue Multaq GDMT per primary cardiology team Fluid status appears stable today   Follow up in the AF clinic in 6 months.     Jorja Loa PA-C [facility] [address] [place], [place] [address] [phone] 04/06/2024 9:38 AM

## 2024-04-28 ENCOUNTER — Other Ambulatory Visit (HOSPITAL_BASED_OUTPATIENT_CLINIC_OR_DEPARTMENT_OTHER): Payer: Self-pay

## 2024-04-28 DIAGNOSIS — E66813 Obesity, class 3: Secondary | ICD-10-CM | POA: Diagnosis not present

## 2024-04-28 DIAGNOSIS — Z23 Encounter for immunization: Secondary | ICD-10-CM | POA: Diagnosis not present

## 2024-04-28 DIAGNOSIS — F331 Major depressive disorder, recurrent, moderate: Secondary | ICD-10-CM | POA: Diagnosis not present

## 2024-04-28 DIAGNOSIS — I480 Paroxysmal atrial fibrillation: Secondary | ICD-10-CM | POA: Diagnosis not present

## 2024-04-28 MED ORDER — SERTRALINE HCL 50 MG PO TABS
75.0000 mg | ORAL_TABLET | Freq: Every day | ORAL | 1 refills | Status: AC
  Filled 2024-04-28 – 2024-05-17 (×3): qty 135, 90d supply, fill #0
  Filled 2024-08-12 – 2024-08-19 (×3): qty 135, 90d supply, fill #1

## 2024-04-28 MED ORDER — XARELTO 20 MG PO TABS
20.0000 mg | ORAL_TABLET | Freq: Every day | ORAL | 5 refills | Status: DC
  Filled 2024-04-28 – 2024-06-28 (×3): qty 30, 30d supply, fill #0
  Filled 2024-07-20 – 2024-07-28 (×2): qty 30, 30d supply, fill #1
  Filled 2024-08-12 – 2024-08-26 (×3): qty 30, 30d supply, fill #2
  Filled 2024-09-16 – 2024-09-22 (×3): qty 30, 30d supply, fill #3
  Filled 2024-10-22: qty 30, 30d supply, fill #4
  Filled 2024-11-16 – 2024-11-22 (×2): qty 30, 30d supply, fill #5

## 2024-05-11 ENCOUNTER — Other Ambulatory Visit (HOSPITAL_BASED_OUTPATIENT_CLINIC_OR_DEPARTMENT_OTHER): Payer: Self-pay

## 2024-05-11 ENCOUNTER — Other Ambulatory Visit: Payer: Self-pay

## 2024-05-11 MED ORDER — PANTOPRAZOLE SODIUM 40 MG PO TBEC
40.0000 mg | DELAYED_RELEASE_TABLET | ORAL | 2 refills | Status: DC
  Filled 2024-05-11: qty 30, 30d supply, fill #0
  Filled 2024-06-17: qty 30, 30d supply, fill #1
  Filled 2024-07-13: qty 30, 30d supply, fill #2

## 2024-05-12 ENCOUNTER — Other Ambulatory Visit: Payer: Self-pay

## 2024-05-17 ENCOUNTER — Other Ambulatory Visit (HOSPITAL_BASED_OUTPATIENT_CLINIC_OR_DEPARTMENT_OTHER): Payer: Self-pay

## 2024-05-23 NOTE — Progress Notes (Unsigned)
 Cardiology Office Note:   Date:  05/26/2024  ID:  Russell [name], DOB 1976/07/04, MRN [id number] PCP:  Rico Charters, NP  [facility] Providers Cardiologist:  Alyssa Backbone, MD Referring MD: Rico Charters, NP  Chief Complaint/Reason for Referral: Cardiology follow-up ASSESSMENT:    1. Idiopathic pericarditis, unspecified chronicity   2. Paroxysmal atrial fibrillation (HCC)   3. Secondary hypercoagulable state (HCC)   4. BMI 45.0-49.9, adult (HCC)   5. Deep vein thrombosis (DVT) of proximal lower extremity, unspecified chronicity, unspecified laterality (HCC)   6. Prediabetes       PLAN:   In order of problems listed above: Pericarditis: Increase colchicine  back to 0.6 mg twice daily for 1 more month and then discontinue completely.  Will check CRP and echocardiogram in 2 months. Paroxysmal atrial fibrillation: Status post cardioversion recently.  Continue Xarelto  20 mg daily and Multaq  400 mg twice daily.   Secondary hypercoagulable state: Continue Xarelto  20 mg daily.   Elevated BMI: Hemoglobin A1c consistent with prediabetes; continue diet and exercise modification.   Deep vein thrombosis: History of unprovoked DVT, continue Xarelto  20 mg daily indefinitely Prediabetes: Monitor.            Dispo:  Return in about 6 months (around 11/26/2024).      Medication Adjustments/Labs and Tests Ordered: Current medicines are reviewed at length with the patient today.  Concerns regarding medicines are outlined above.  The following changes have been made:  no change   Labs/tests ordered: Orders Placed This Encounter  Procedures   C-reactive protein   ECHOCARDIOGRAM COMPLETE    Medication Changes: Meds ordered this encounter  Medications   colchicine  0.6 MG tablet    Sig: Take 1 tablet (0.6 mg total) by mouth 2 (two) times daily.    Dispense:  60 tablet    Refill:  0    Current medicines are reviewed at length with the patient today.  The patient does not  have concerns regarding medicines.  I spent 33 minutes reviewing all clinical data during and prior to this visit including all relevant imaging studies, laboratories, clinical information from other health systems, and prior notes from both Cardiology and other specialties, interviewing the patient, and conducting a complete physical examination in order to formulate a comprehensive and personalized evaluation and treatment plan.  History of Present Illness:      FOCUSED PROBLEM LIST:   Atrial flutter Ablation 2021 Atrial fibrillation  CV 2 score 0 (on Xarelto  for unprovoked DVT) Followed by atrial fibrillation clinic CV 2021 (in ED) CV March 2023 BMI 48 DVT April 2023 Unprovoked; on chronic Xarelto  Pericardial calcification Chest CT 2021 TTE without findings suggestive of constriction; EF 50 to 55% 2023 CMR 2025 consistent with constriction and pericarditis >> ibruprofren + colchicine  Prediabetes Hemoglobin A1c 5.7 2025  May 2023: The patient is a 48 y.o. male with the indicated medical history here to establish general cardiology care.  The patient was seen by his primary care provider recently.  He had developed a DVT and was seen in the emergency department at an outside facility.  He had no inciting factors.  He was started on Xarelto .  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   He is tolerating Xarelto  well.  He denies any significant shortness of breath,  chest pain, palpitations, paroxysmal nocturnal dyspnea, orthopnea.  He is working to get fitted for CPAP though he cannot afford the co-pay right now.  He is required no recent emergency room visits or hospitalizations since being in the hospital for his DVT.  He is otherwise well without complaints.  Plan: Obtain  echocardiogram; refer to pharmacy given elevated BMI.   May 2024: In the interim the patient was seen by pharmacy and started on Wegovy.  Unfortunately his insurance would not cover this and so this was stopped.  An echocardiogram was done which demonstrated a thickened and calcified pericardium with no evidence of tamponade.  His ejection fraction was preserved.  He had no significant valvular abnormalities.  He was seen by his PCP and they elected to pursue indefinite anticoagulation due to his unprovoked DVT.  The patient is otherwise doing well.  He denies any shortness of breath, presyncope, syncope, paroxysmal nocturnal dyspnea, or palpitations.  He does have occasional dependent edema.  He fortunately has not required hospitalization.  Plan: Follow-up in 18 months.  November 2024: The patient is referred for cardiology opinion.  The patient developed right groin pain and bloody stools.  He underwent a CT abdomen pelvis which demonstrated bilateral inguinal lymphadenopathy and borderline hepatomegaly with a dilated IVC and reflux of contrast into the hepatic veins.  A CMP at that time demonstrated no elevation in LFTs.  He is referred for further recommendations.  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  He is able to do all of his activities of daily living without any issues.  Denies any severe shortness of breath, palpitations, or paroxysmal nocturnal dyspnea.  He does have chronic lower extremity edema with the right worse than the left and this has been present since his DVT.  He is scheduled to undergo an EGD in the coming weeks.  Plan: Obtain CMP and echocardiogram to evaluate abnormal liver function.  March 2025: Patient consents to use of AI scribe. In the interim his CMP showed no abnormalities.  An echocardiogram demonstrated septal bounce and pericardial calcification and therefore a CMR was performed to evaluate further which was concerning for pericardial constriction  and pericarditis.  CRP was ordered but never drawn.  He was started on ibuprofen  600 mg 3 times daily for 2 weeks, colchicine  0.6 mg twice daily for 3 months.  He was seen in atrial fibrillation clinic and was doing well.  He is here for follow-up.  He completed a course of ibuprofen , missing two doses, and started it two days late due to financial constraints. He is currently on colchicine , which he has not missed.  He monitors his heart rhythm with a watch and notes being in atrial fibrillation most of the time. He experiences occasional palpitations, especially during exertion, but they are not severe. No chest pain or breathing difficulties. He has not experienced any bleeding while on Xarelto  and has not had any hospitalizations or emergency room visits recently.  He denies experiencing chest discomfort with deep breaths.  He has been eating better for 107 days and notes weight loss.  Plan: Check CRP and echocardiogram after colchicine  course completed.  May 2025:  Patient consents to use of AI scribe. In the interim the patient underwent cardioversion as arranged by atrial fibrillation clinic.  His hemoglobin A1c was consistent with prediabetes.  His colchicine  was decreased to once a day given nausea.  He was seen by atrial fibrillation clinic and was doing well.  The  patient continues to do well.  He thinks that his nausea was likely due to the aftereffects of anesthesia around his cardioversion.  He denies any shortness of breath or chest pain.  He remains in normal sinus rhythm without signs or symptoms of atrial fibrillation.  He denies any palpitations.  He has had no severe bleeding or bruising while on Xarelto .  He is otherwise well without complaints today.         Current Medications: Current Meds  Medication Sig   colchicine  0.6 MG tablet Take 1 tablet (0.6 mg total) by mouth 2 (two) times daily.   dronedarone  (MULTAQ ) 400 MG tablet Take 1 tablet (400 mg total) by mouth 2 (two) times  daily with a meal.   Multiple Vitamin (ONE-A-DAY MENS PO) Take 1 tablet by mouth in the morning.   Omega-3 Fatty Acids (FISH OIL PO) Take 1 capsule by mouth daily.   ondansetron  (ZOFRAN -ODT) 4 MG disintegrating tablet Take 1 tablet (4 mg total) by mouth every 8 (eight) hours as needed for nausea or vomiting.   pantoprazole  (PROTONIX ) 40 MG tablet Take 1 tablet (40 mg total) by mouth every morning before breakfast.   rivaroxaban  (XARELTO ) 20 MG TABS tablet Take 1 tablet (20 mg total) by mouth daily.   rivaroxaban  (XARELTO ) 20 MG TABS tablet Take 1 tablet (20 mg total) by mouth daily.   sertraline  (ZOLOFT ) 50 MG tablet Take 1.5 tablets (75 mg total) by mouth daily.   [DISCONTINUED] colchicine  0.6 MG tablet Take 1 tablet (0.6 mg total) by mouth daily.     Review of Systems:   Please see the history of present illness.    All other systems reviewed and are negative.     EKGs/Labs/Other Test Reviewed:   EKG: EKG from February 2024 demonstrates atrial fibrillation  EKG Interpretation Date/Time:    Ventricular Rate:    PR Interval:    QRS Duration:    QT Interval:    QTC Calculation:   R Axis:      Text Interpretation:           Risk Assessment/Calculations:    CHA2DS2-VASc Score = 1   This indicates a 0.6% annual risk of stroke. The patient's score is based upon: CHF History: 1 HTN History: 0 Diabetes History: 0 Stroke History: 0 Vascular Disease History: 0 Age Score: 0 Gender Score: 0          Physical Exam:   VS:  BP 110/78   Pulse (!) 56   Ht 5\' 8"  (1.727 m)   Wt 258 lb (117 kg)   SpO2 97%   BMI 39.23 kg/m        Wt Readings from Last 3 Encounters:  05/26/24 258 lb (117 kg)  04/06/24 273 lb 12.8 oz (124.2 kg)  03/10/24 274 lb 12.8 oz (124.6 kg)      GENERAL:  No apparent distress, AOx3 HEENT:  No carotid bruits, +2 carotid impulses, no scleral icterus CAR: Irregular RR no murmurs, gallops, rubs, or thrills RES:  Clear to auscultation bilaterally ABD:   Soft, nontender, nondistended, positive bowel sounds x 4 VASC:  +2 radial pulses, +2 carotid pulses NEURO:  CN 2-12 grossly intact; motor and sensory grossly intact PSYCH:  No active depression or anxiety EXT: +3 edema lower extremity bilaterally, no ecchymosis, or cyanosis  Signed, Kimiye Strathman K Audreana Hancox, MD  05/26/2024 11:30 AM    [facility] [address] [place], [place], [place]  [address] Phone: [phone];  Fax: [phone]   Note:  This document was prepared using Dragon voice recognition software and may include unintentional dictation errors.

## 2024-05-26 ENCOUNTER — Other Ambulatory Visit (HOSPITAL_BASED_OUTPATIENT_CLINIC_OR_DEPARTMENT_OTHER): Payer: Self-pay

## 2024-05-26 ENCOUNTER — Ambulatory Visit: Attending: Internal Medicine | Admitting: Internal Medicine

## 2024-05-26 ENCOUNTER — Encounter: Payer: Self-pay | Admitting: Internal Medicine

## 2024-05-26 VITALS — BP 110/78 | HR 56 | Ht 68.0 in | Wt 258.0 lb

## 2024-05-26 DIAGNOSIS — R7303 Prediabetes: Secondary | ICD-10-CM

## 2024-05-26 DIAGNOSIS — I824Y9 Acute embolism and thrombosis of unspecified deep veins of unspecified proximal lower extremity: Secondary | ICD-10-CM

## 2024-05-26 DIAGNOSIS — Z6841 Body Mass Index (BMI) 40.0 and over, adult: Secondary | ICD-10-CM | POA: Diagnosis not present

## 2024-05-26 DIAGNOSIS — I300 Acute nonspecific idiopathic pericarditis: Secondary | ICD-10-CM

## 2024-05-26 DIAGNOSIS — I480 Paroxysmal atrial fibrillation: Secondary | ICD-10-CM

## 2024-05-26 DIAGNOSIS — D6869 Other thrombophilia: Secondary | ICD-10-CM | POA: Diagnosis not present

## 2024-05-26 MED ORDER — COLCHICINE 0.6 MG PO TABS
0.6000 mg | ORAL_TABLET | Freq: Two times a day (BID) | ORAL | 0 refills | Status: DC
  Filled 2024-05-26: qty 60, 30d supply, fill #0

## 2024-05-26 NOTE — Patient Instructions (Signed)
 Medication Instructions:  Your physician has recommended you make the following change in your medication:  1.) start colchicine  0.6 mg - one tablet twice daily for one month  *If you need a refill on your cardiac medications before your next appointment, please call your pharmacy*  Lab Work: In 2 months - check blood work (CRP)  Testing/Procedures:ECHO DUE END OF JULY Your physician has requested that you have an echocardiogram. Echocardiography is a painless test that uses sound waves to create images of your heart. It provides your doctor with information about the size and shape of your heart and how well your heart's chambers and valves are working. This procedure takes approximately one hour. There are no restrictions for this procedure. Please do NOT wear cologne, perfume, aftershave, or lotions (deodorant is allowed). Please arrive 15 minutes prior to your appointment time.  Please note: We ask at that you not bring children with you during ultrasound (echo/ vascular) testing. Due to room size and safety concerns, children are not allowed in the ultrasound rooms during exams. Our front office staff cannot provide observation of children in our lobby area while testing is being conducted. An adult accompanying a patient to their appointment will only be allowed in the ultrasound room at the discretion of the ultrasound technician under special circumstances. We apologize for any inconvenience.   Follow-Up: At [facility], you and your health needs are our priority.  As part of our continuing mission to provide you with exceptional heart care, our providers are all part of one team.  This team includes your primary Cardiologist (physician) and Advanced Practice Providers or APPs (Physician Assistants and Nurse Practitioners) who all work together to provide you with the care you need, when you need it.  Your next appointment:   6 month(s)  Provider:   Arun K Thukkani, MD

## 2024-05-31 ENCOUNTER — Other Ambulatory Visit (HOSPITAL_BASED_OUTPATIENT_CLINIC_OR_DEPARTMENT_OTHER): Payer: Self-pay

## 2024-05-31 ENCOUNTER — Other Ambulatory Visit: Payer: Self-pay

## 2024-06-03 DIAGNOSIS — K7460 Unspecified cirrhosis of liver: Secondary | ICD-10-CM | POA: Diagnosis not present

## 2024-06-03 DIAGNOSIS — K824 Cholesterolosis of gallbladder: Secondary | ICD-10-CM | POA: Diagnosis not present

## 2024-06-17 ENCOUNTER — Other Ambulatory Visit: Payer: Self-pay

## 2024-06-17 DIAGNOSIS — K7460 Unspecified cirrhosis of liver: Secondary | ICD-10-CM | POA: Diagnosis not present

## 2024-06-28 ENCOUNTER — Other Ambulatory Visit: Payer: Self-pay

## 2024-06-28 ENCOUNTER — Other Ambulatory Visit (HOSPITAL_COMMUNITY): Payer: Self-pay

## 2024-06-28 ENCOUNTER — Other Ambulatory Visit (HOSPITAL_COMMUNITY): Payer: Self-pay | Admitting: Physician Assistant

## 2024-06-28 MED ORDER — MULTAQ 400 MG PO TABS
400.0000 mg | ORAL_TABLET | Freq: Two times a day (BID) | ORAL | 3 refills | Status: DC
  Filled 2024-06-28 – 2024-07-13 (×3): qty 60, 30d supply, fill #0
  Filled 2024-08-09: qty 60, 30d supply, fill #1
  Filled 2024-08-19 – 2024-09-16 (×3): qty 60, 30d supply, fill #2
  Filled 2024-10-12: qty 60, 30d supply, fill #3

## 2024-07-09 ENCOUNTER — Other Ambulatory Visit: Payer: Self-pay

## 2024-07-09 ENCOUNTER — Other Ambulatory Visit (HOSPITAL_COMMUNITY): Payer: Self-pay

## 2024-07-13 ENCOUNTER — Other Ambulatory Visit: Payer: Self-pay

## 2024-07-13 ENCOUNTER — Other Ambulatory Visit (HOSPITAL_COMMUNITY): Payer: Self-pay

## 2024-07-14 ENCOUNTER — Other Ambulatory Visit (HOSPITAL_BASED_OUTPATIENT_CLINIC_OR_DEPARTMENT_OTHER): Payer: Self-pay

## 2024-07-21 ENCOUNTER — Other Ambulatory Visit: Payer: Self-pay

## 2024-07-27 ENCOUNTER — Ambulatory Visit (HOSPITAL_COMMUNITY)
Admission: RE | Admit: 2024-07-27 | Discharge: 2024-07-27 | Disposition: A | Discharge status: Home / Self Care | Source: Ambulatory Visit | Attending: Internal Medicine | Admitting: Internal Medicine

## 2024-07-27 ENCOUNTER — Ambulatory Visit: Payer: Self-pay | Admitting: Internal Medicine

## 2024-07-27 DIAGNOSIS — I300 Acute nonspecific idiopathic pericarditis: Secondary | ICD-10-CM | POA: Diagnosis not present

## 2024-07-27 LAB — ECHOCARDIOGRAM COMPLETE
Area-P 1/2: 4.49 cm2
S' Lateral: 3.6 cm

## 2024-07-29 ENCOUNTER — Other Ambulatory Visit (HOSPITAL_COMMUNITY): Payer: Self-pay

## 2024-07-29 ENCOUNTER — Other Ambulatory Visit: Payer: Self-pay

## 2024-08-09 ENCOUNTER — Other Ambulatory Visit (HOSPITAL_COMMUNITY): Payer: Self-pay

## 2024-08-10 ENCOUNTER — Other Ambulatory Visit (HOSPITAL_COMMUNITY): Payer: Self-pay

## 2024-08-12 ENCOUNTER — Other Ambulatory Visit: Payer: Self-pay

## 2024-08-12 ENCOUNTER — Encounter (HOSPITAL_COMMUNITY): Payer: Self-pay

## 2024-08-12 ENCOUNTER — Other Ambulatory Visit (HOSPITAL_COMMUNITY): Payer: Self-pay

## 2024-08-13 ENCOUNTER — Other Ambulatory Visit (HOSPITAL_COMMUNITY): Payer: Self-pay

## 2024-08-16 ENCOUNTER — Other Ambulatory Visit (HOSPITAL_BASED_OUTPATIENT_CLINIC_OR_DEPARTMENT_OTHER): Payer: Self-pay

## 2024-08-16 MED ORDER — PANTOPRAZOLE SODIUM 40 MG PO TBEC
40.0000 mg | DELAYED_RELEASE_TABLET | Freq: Every morning | ORAL | 1 refills | Status: AC
  Filled 2024-08-16 – 2024-09-16 (×2): qty 90, 90d supply, fill #0
  Filled 2024-12-18 – 2024-12-20 (×2): qty 90, 90d supply, fill #1

## 2024-08-19 ENCOUNTER — Other Ambulatory Visit: Payer: Self-pay

## 2024-08-19 ENCOUNTER — Other Ambulatory Visit (HOSPITAL_COMMUNITY): Payer: Self-pay

## 2024-08-25 ENCOUNTER — Other Ambulatory Visit (HOSPITAL_COMMUNITY): Payer: Self-pay

## 2024-08-26 ENCOUNTER — Other Ambulatory Visit (HOSPITAL_BASED_OUTPATIENT_CLINIC_OR_DEPARTMENT_OTHER): Payer: Self-pay

## 2024-08-26 ENCOUNTER — Other Ambulatory Visit: Payer: Self-pay | Admitting: Internal Medicine

## 2024-08-26 DIAGNOSIS — I300 Acute nonspecific idiopathic pericarditis: Secondary | ICD-10-CM

## 2024-08-27 ENCOUNTER — Other Ambulatory Visit (HOSPITAL_COMMUNITY): Payer: Self-pay

## 2024-08-27 ENCOUNTER — Other Ambulatory Visit: Payer: Self-pay

## 2024-08-27 NOTE — Telephone Encounter (Signed)
 Thukkani, Arun K, MD  He needs his CRP checked prior to me determining whether the colchicine  can be stopped or not.  He did not have this checked a month ago.  Can you have him get it drawn next week and I will determine the need to continue colchicine , thanks _______________________________________________________   Called and left detailed message on patient's VM (DPR).  Adv of above.  Adv of lab hours at [facility].  Adv once Dr. Wendel sees the lab results he will determine if pt should continue or stop the colchicine .

## 2024-08-27 NOTE — Telephone Encounter (Signed)
 Pt of Dr. Wendel. During last OV this RX looks to be needing to be discontinued. Please advise on if a refill is Necessary.

## 2024-08-31 NOTE — Telephone Encounter (Signed)
 Attempted to call pt, unable to reach. Left detailed VM per DPR on file again and explained the need for the blood draw prior to us  being able to approve the refill of the Colchicine . Asked pt to give our office a call back to let us  know he has received the message.

## 2024-09-01 ENCOUNTER — Encounter (HOSPITAL_BASED_OUTPATIENT_CLINIC_OR_DEPARTMENT_OTHER): Payer: Self-pay

## 2024-09-01 ENCOUNTER — Emergency Department (HOSPITAL_BASED_OUTPATIENT_CLINIC_OR_DEPARTMENT_OTHER)

## 2024-09-01 ENCOUNTER — Emergency Department (HOSPITAL_BASED_OUTPATIENT_CLINIC_OR_DEPARTMENT_OTHER)
Admission: EM | Admit: 2024-09-01 | Discharge: 2024-09-01 | Disposition: A | Discharge status: Home / Self Care | Attending: Emergency Medicine | Admitting: Emergency Medicine

## 2024-09-01 ENCOUNTER — Other Ambulatory Visit (HOSPITAL_BASED_OUTPATIENT_CLINIC_OR_DEPARTMENT_OTHER): Payer: Self-pay

## 2024-09-01 ENCOUNTER — Other Ambulatory Visit: Payer: Self-pay

## 2024-09-01 DIAGNOSIS — M79662 Pain in left lower leg: Secondary | ICD-10-CM | POA: Diagnosis not present

## 2024-09-01 DIAGNOSIS — L03116 Cellulitis of left lower limb: Secondary | ICD-10-CM | POA: Diagnosis not present

## 2024-09-01 DIAGNOSIS — I8392 Asymptomatic varicose veins of left lower extremity: Secondary | ICD-10-CM | POA: Diagnosis not present

## 2024-09-01 DIAGNOSIS — Z7901 Long term (current) use of anticoagulants: Secondary | ICD-10-CM | POA: Diagnosis not present

## 2024-09-01 DIAGNOSIS — L539 Erythematous condition, unspecified: Secondary | ICD-10-CM | POA: Diagnosis not present

## 2024-09-01 MED ORDER — DOXYCYCLINE HYCLATE 100 MG PO CAPS
100.0000 mg | ORAL_CAPSULE | Freq: Two times a day (BID) | ORAL | 0 refills | Status: AC
  Filled 2024-09-01: qty 14, 7d supply, fill #0

## 2024-09-01 MED ORDER — DOXYCYCLINE HYCLATE 100 MG PO TABS
100.0000 mg | ORAL_TABLET | Freq: Once | ORAL | Status: AC
Start: 2024-09-01 — End: 2024-09-01
  Administered 2024-09-01: 100 mg via ORAL
  Filled 2024-09-01: qty 1

## 2024-09-01 NOTE — Discharge Instructions (Addendum)
 It was a pleasure taking care of you today.  As discussed, I suspect your redness is from an infection of the skin.  I am sending you home with antibiotics.  Take as prescribed and finish all antibiotics.  If your symptoms worsen after 24 to 48 hours on antibiotics please return to the ED for further evaluation. Your ultrasound did not show a blood clot. Your x-ray was normal. Return to the ER for new or worsening symptoms.

## 2024-09-01 NOTE — ED Provider Notes (Signed)
 [facility] Provider Note   CSN: [id number] Arrival date & time: 09/01/24  1151     Patient presents with: Leg Pain   Russell [name] is a 48 y.o. male with a past medical history significant for A-fib on chronic Xarelto , depression, and OSA who presents to the ED due to left lower extremity erythema and pain that started last night.  No known injury.  Denies any insect bites.  Patient states he has been compliant with his Xarelto .  No chest pain or shortness of breath.  Notes pain surrounds the posterior aspect of his left ankle associated with erythema.  Denies numbness/tingling.  History obtained from patient and past medical records. No interpreter used during encounter.      Prior to Admission medications   Medication Sig Start Date End Date Taking? Authorizing Provider  doxycycline  (VIBRAMYCIN ) 100 MG capsule Take 1 capsule (100 mg total) by mouth 2 (two) times daily for 7 days. 09/01/24 09/08/24 Yes Chaylee Ehrsam, Aleck BROCKS, PA-C  colchicine  0.6 MG tablet Take 1 tablet (0.6 mg total) by mouth 2 (two) times daily. 05/26/24   Thukkani, Arun K, MD  dronedarone  (MULTAQ ) 400 MG tablet Take 1 tablet (400 mg total) by mouth 2 (two) times daily with a meal. 06/28/24   Fenton, Clint R, PA  Multiple Vitamin (ONE-A-DAY MENS PO) Take 1 tablet by mouth in the morning.    [provider]  Omega-3 Fatty Acids (FISH OIL PO) Take 1 capsule by mouth daily.    [provider]  ondansetron  (ZOFRAN -ODT) 4 MG disintegrating tablet Take 1 tablet (4 mg total) by mouth every 8 (eight) hours as needed for nausea or vomiting. 03/16/24     pantoprazole  (PROTONIX ) 40 MG tablet Take 1 tablet (40 mg total) by mouth every morning before breakfast. 05/11/24     pantoprazole  (PROTONIX ) 40 MG tablet Take 1 tablet (40 mg total) by mouth every morning. 08/16/24     rivaroxaban  (XARELTO ) 20 MG TABS tablet Take 1 tablet (20 mg total) by mouth daily. 04/02/24     rivaroxaban   (XARELTO ) 20 MG TABS tablet Take 1 tablet (20 mg total) by mouth daily. 04/28/24     sertraline  (ZOLOFT ) 50 MG tablet Take 1.5 tablets (75 mg total) by mouth daily. 04/28/24     apixaban  (ELIQUIS ) 5 MG TABS tablet Take 1 tablet (5 mg total) by mouth 2 (two) times daily for 30 days. 06/18/19 03/02/20  Garrick Charleston, MD    Allergies: Patient has no known allergies.    Review of Systems  Constitutional:  Negative for fever.  Musculoskeletal:  Positive for arthralgias.  Skin:  Positive for color change.    Updated Vital Signs BP 104/65 (BP Location: Left Arm)   Pulse 67   Temp 97.8 F (36.6 C) (Oral)   Resp 16   Wt 102.5 kg   SpO2 99%   BMI 34.36 kg/m   Physical Exam Vitals and nursing note reviewed.  Constitutional:      General: He is not in acute distress.    Appearance: He is not ill-appearing.  HENT:     Head: Normocephalic.  Eyes:     Pupils: Pupils are equal, round, and reactive to light.  Cardiovascular:     Rate and Rhythm: Normal rate and regular rhythm.     Pulses: Normal pulses.     Heart sounds: Normal heart sounds. No murmur heard.    No friction rub. No gallop.  Pulmonary:  Effort: Pulmonary effort is normal.     Breath sounds: Normal breath sounds.  Abdominal:     General: Abdomen is flat. There is no distension.     Palpations: Abdomen is soft.     Tenderness: There is no abdominal tenderness. There is no guarding or rebound.  Musculoskeletal:        General: Normal range of motion.     Cervical back: Neck supple.     Comments: Erythema to posterior left ankle that goes proximally up left calf. See photo below. Full ROM of left ankle. Pedal pulses palpable. Slight skin break around achilles tendon. No other abrasions.   Skin:    General: Skin is warm and dry.  Neurological:     General: No focal deficit present.     Mental Status: He is alert.  Psychiatric:        Mood and Affect: Mood normal.        Behavior: Behavior normal.     (all labs  ordered are listed, but only abnormal results are displayed) Labs Reviewed - No data to display  EKG: None  Radiology: DG Ankle Complete Left Result Date: 09/01/2024 CLINICAL DATA:  Left ankle redness. EXAM: LEFT ANKLE COMPLETE - 3+ VIEW COMPARISON:  None Available. FINDINGS: There is no evidence of fracture, dislocation, or joint effusion. There is no evidence of arthropathy or other focal bone abnormality. Soft tissues are unremarkable. IMPRESSION: Negative. Electronically Signed   By: Lynwood Landy Raddle M.D.   On: 09/01/2024 13:52   US  Venous Img Lower Unilateral Left Result Date: 09/01/2024 CLINICAL DATA:  Left calf pain and redness. EXAM: LEFT LOWER EXTREMITY VENOUS DOPPLER ULTRASOUND TECHNIQUE: Gray-scale sonography with graded compression, as well as color Doppler and duplex ultrasound were performed to evaluate the lower extremity deep venous systems from the level of the common femoral vein and including the common femoral, femoral, profunda femoral, popliteal and calf veins including the posterior tibial, peroneal and gastrocnemius veins when visible. Spectral Doppler was utilized to evaluate flow at rest and with distal augmentation maneuvers in the common femoral, femoral and popliteal veins. COMPARISON:  10/27/2020 FINDINGS: Contralateral Common Femoral Vein: Respiratory phasicity is normal and symmetric with the symptomatic side. No evidence of thrombus. Normal compressibility. Common Femoral Vein: No evidence of thrombus. Normal compressibility, respiratory phasicity and response to augmentation. Saphenofemoral Junction: No evidence of thrombus. Normal compressibility and flow on color Doppler imaging. Profunda Femoral Vein: No evidence of thrombus. Normal compressibility and flow on color Doppler imaging. Femoral Vein: No evidence of thrombus. Normal compressibility, respiratory phasicity and response to augmentation. Popliteal Vein: No evidence of thrombus. Normal compressibility, respiratory  phasicity and response to augmentation. Evidence for reflux in left popliteal vein. Calf Veins: No evidence of thrombus. Normal compressibility and flow on color Doppler imaging. Other Findings: Compressible varicose veins in the left lower extremity. IMPRESSION: 1.  Negative for deep venous thrombosis in left lower extremity. 2. Compressible varicosities in left lower extremity. Findings suggest underlying venous insufficiency in the left lower extremity. Electronically Signed   By: Juliene Balder M.D.   On: 09/01/2024 13:18     Procedures   Medications Ordered in the ED - No data to display                                  Medical Decision Making Amount and/or Complexity of Data Reviewed Radiology: ordered and independent interpretation performed. Decision-making  details documented in ED Course.  Risk Prescription drug management.   This patient presents to the ED for concern of LLE pain, this involves an extensive number of treatment options, and is a complaint that carries with it a high risk of complications and morbidity.  The differential diagnosis includes cellulitis, bony fracture, DVT, etc  47 year old male presents to the ED due to left lower extremity erythema and pain.  Previous history of DVT.  On chronic Xarelto  which he has been compliant with.  No chest pain or shortness of breath.  No injury.  Upon arrival, vitals all within normal limits.  Patient in no acute distress. Erythema to posterior left ankle that goes proximally up left calf. See photo below. Full ROM of left ankle. Pedal pulses palpable. Slight skin break around achilles tendon. No other abrasions. Concern for possible cellulitis. Edges of erythema marked. Given patient's history of DVT, US  ordered.  X-ray personally reviewed and interpreted which is negative for any bony fractures.  Ultrasound negative for DVT.  Does demonstrate compressible varicosities in left lower extremity suggestive of venous insufficiency.   Given patient's erythema will treat for cellulitis with doxycycline . Low suspicion for sepsis. Patient well appearing on exam, so will hold off on labs at this time. Advised patient to return to the ED if worsening of erythema after 24-48 hours on antibiotics. Patient stable for discharge. Strict ED precautions discussed with patient. Patient states understanding and agrees to plan. Patient discharged home in no acute distress and stable vitals  Hx A. Fib, DVT Has PCP    Final diagnoses:  Cellulitis of left lower extremity    ED Discharge Orders          Ordered    doxycycline  (VIBRAMYCIN ) 100 MG capsule  2 times daily        09/01/24 1414               Lorelle Aleck BROCKS, PA-C 09/01/24 1418    Yolande Lamar BROCKS, MD 09/01/24 [phone]

## 2024-09-01 NOTE — ED Triage Notes (Signed)
 Reports pain in L leg since last night. Redness and swelling noted to back of L calf. Denies loss of sensation or mobility.   Pedal pulse 2+. Ambulatory   Hx of DVT in R leg

## 2024-09-01 NOTE — ED Notes (Signed)
 Patient transported to Ultrasound

## 2024-09-03 ENCOUNTER — Other Ambulatory Visit (HOSPITAL_COMMUNITY): Payer: Self-pay

## 2024-09-03 ENCOUNTER — Encounter (HOSPITAL_COMMUNITY): Payer: Self-pay

## 2024-09-03 ENCOUNTER — Telehealth: Payer: Self-pay | Admitting: Internal Medicine

## 2024-09-03 NOTE — Telephone Encounter (Signed)
 Patient needs labs before this can be refilled, staff attempted to reach patient 9/2.

## 2024-09-03 NOTE — Telephone Encounter (Signed)
 Pt's pharmacy is requesting a refill on non cardiac medication colchicine  0.6 mg tablet. Would Dr. Wendel like to refill this medication? Please address

## 2024-09-16 ENCOUNTER — Other Ambulatory Visit (HOSPITAL_COMMUNITY): Payer: Self-pay

## 2024-09-17 ENCOUNTER — Other Ambulatory Visit (HOSPITAL_COMMUNITY): Payer: Self-pay

## 2024-09-17 ENCOUNTER — Other Ambulatory Visit: Payer: Self-pay

## 2024-09-20 NOTE — Telephone Encounter (Signed)
 Patient called the office, advised him that he needs to have lab work drawn in order to get further refills. He says he will go to [facility] today.

## 2024-09-22 ENCOUNTER — Other Ambulatory Visit: Payer: Self-pay

## 2024-09-29 ENCOUNTER — Other Ambulatory Visit (HOSPITAL_COMMUNITY): Payer: Self-pay

## 2024-09-29 DIAGNOSIS — I300 Acute nonspecific idiopathic pericarditis: Secondary | ICD-10-CM | POA: Diagnosis not present

## 2024-09-30 DIAGNOSIS — D696 Thrombocytopenia, unspecified: Secondary | ICD-10-CM | POA: Diagnosis not present

## 2024-09-30 DIAGNOSIS — K7460 Unspecified cirrhosis of liver: Secondary | ICD-10-CM | POA: Diagnosis not present

## 2024-09-30 DIAGNOSIS — K760 Fatty (change of) liver, not elsewhere classified: Secondary | ICD-10-CM | POA: Diagnosis not present

## 2024-09-30 DIAGNOSIS — I8510 Secondary esophageal varices without bleeding: Secondary | ICD-10-CM | POA: Diagnosis not present

## 2024-09-30 LAB — C-REACTIVE PROTEIN: CRP: 1 mg/L (ref 0–10)

## 2024-10-01 DIAGNOSIS — L03116 Cellulitis of left lower limb: Secondary | ICD-10-CM | POA: Diagnosis not present

## 2024-10-01 DIAGNOSIS — M7989 Other specified soft tissue disorders: Secondary | ICD-10-CM | POA: Diagnosis not present

## 2024-10-01 DIAGNOSIS — M79605 Pain in left leg: Secondary | ICD-10-CM | POA: Diagnosis not present

## 2024-10-01 NOTE — ED Provider Notes (Signed)
 Chief Complaint  Patient presents with  . Leg Pain    LLE red x 1 day       HPI HPI  48 year old male with past medical history of atrial fibrillation on Xarelto , OSA presents to the emerged from today with complaint of left lower extremity pain and swelling x 1 day.  Patient denies any injury or trauma.  Reports subjective fever last night when pain started.  No drainage or weeping.  No chest pain or shortness of breath.  Patient denies any history of DVT or PE.  He has not missed any doses of his Xarelto .  No recent travel, immobilization, malignancy or surgeries.  States he had a similar episode about 1 month ago and he was diagnosed with cellulitis.  Reports improvement after antibiotics.  Patient History Medical History[1] Surgical History[2] Family History[3] Social History[4]    Review of Systems Review of Systems  As per HPI otherwise negative.  Physical Exam ED Triage Vitals [10/01/24 1920]  Temp 98 F (36.7 C)  Heart Rate 64  Resp 18  BP 107/65  MAP (mmHg) 78  SpO2 97 %  O2 Device None (Room air)  O2 Flow Rate (L/min)   Weight 104 kg (230 lb)   Physical Exam Constitutional:      Appearance: Normal appearance.  HENT:     Head: Normocephalic and atraumatic.     Nose: Nose normal.     Mouth/Throat:     Mouth: Mucous membranes are moist.     Pharynx: Oropharynx is clear.  Eyes:     Pupils: Pupils are equal, round, and reactive to light.  Cardiovascular:     Rate and Rhythm: Normal rate and regular rhythm.     Pulses: Normal pulses.  Abdominal:     General: Abdomen is flat.     Palpations: Abdomen is soft.  Musculoskeletal:        General: Normal range of motion.  Skin:    Capillary Refill: Capillary refill takes less than 2 seconds.     Comments: Macular erythema with warmth over left anterior shin with well-demarcated borders.  No pitting edema appreciable.  No fluctuance or induration.  Mild tenderness present.  Multiple varicosities  present over bilateral lower extremities.  No engorgement.  No streaking or palpable cords.  Neurological:     Mental Status: He is alert.     Sensory: No sensory deficit.        CHA2DS2-VASc Score: 0  Glasgow Coma Scale Score: 15                  Procedures                       ED Course & MDM   Medical Decision Making 35M presents to the ER today for the evaluation of leg pain, swelling.   Collateral history obtained from  External records from outside source obtained and reviewed.    Clinical Complexity Patient's presentation is most consistent with acute complicated illness / injury requiring diagnostic workup.   Patient does have comorbidities/underlying diseases, which makes the patient's presentation today higher risk, increases risk of complications and morbidity/mortality of patient management, and ultimately increases complexity of their visit today.  Medical records reviewed independently by me, this is notable for recent diagnosis of cellulitis on 09/01/2024, treated with doxycycline  x 7 days..  DDx: Cellulitis, abscess, DVT, venous insufficiency, thrombophlebitis  Labs and imaging  performed which I independently reviewed and interpreted shows  CBC with stable normocytic anemia.  No leukocytosis. Metabolic panel without renal dysfunction or electrolyte abnormality.  DVT ultrasound with no evidence of DVT.  Admission was discussed and considered with the patient however feel patient stable for outpatient follow-up at this time.  No appreciable fluctuance or induration to suggest abscess.  No fever or chills to suggest systemic illness.  Patient had previous complete improvement after doxycycline  1 month ago and I do not feel that this is related to initial incident requiring IV antibiotics or hospitalization.  Patient is afebrile, no acute distress, hemodynamically stable.  Will treat patient with cephalexin  4 times daily x 1 week.  Recommended cool compresses,  elevation to help with swelling.  We discussed symptoms and would warrant immediate return to the ED.  Patient verbalized understanding and agrees to plan.   Multiple problems were addressed during today's visit as outlined above.   Problems Addressed: Cellulitis of left lower extremity: complicated acute illness or injury  Amount and/or Complexity of Data Reviewed Labs: ordered. Radiology: ordered.  Risk Prescription drug management.      ED Disposition:  Discharge Final diagnoses:  Cellulitis of left lower extremity    ED Prescriptions     Medication Sig Dispense Start Date End Date Auth. Provider   cephALEXin  (KEFLEX ) 500 mg capsule Take 1 capsule (500 mg total) by mouth 4 (four) times a day for 7 days. 28 capsule 10/01/2024 10/08/2024 Dorsey Lemond Ruth, PA-C             [1] Past Medical History: Diagnosis Date  . A-fib    (CMD)   . Atrial flutter    (CMD)   . OSA (obstructive sleep apnea)   [2] Past Surgical History: Procedure Laterality Date  . ATRIAL ABLATION SURGERY    . CARDIOVERSION    [3] No family history on file. [4] Social History Tobacco Use  . Smoking status: Former    Current packs/day: 0.00    Types: Cigarettes    Quit date: 05/31/2019    Years since quitting: 5.3  . Smokeless tobacco: Never  Substance Use Topics  . Alcohol use: Never  . Drug use: Yes    Types: Marijuana

## 2024-10-04 ENCOUNTER — Other Ambulatory Visit (HOSPITAL_BASED_OUTPATIENT_CLINIC_OR_DEPARTMENT_OTHER): Payer: Self-pay

## 2024-10-04 MED ORDER — CEPHALEXIN 500 MG PO CAPS
500.0000 mg | ORAL_CAPSULE | Freq: Four times a day (QID) | ORAL | 0 refills | Status: DC
  Filled 2024-10-04: qty 28, 7d supply, fill #0

## 2024-10-07 ENCOUNTER — Ambulatory Visit (HOSPITAL_COMMUNITY)
Admission: RE | Admit: 2024-10-07 | Discharge: 2024-10-07 | Disposition: A | Discharge status: Home / Self Care | Source: Ambulatory Visit | Attending: Physician Assistant | Admitting: Physician Assistant

## 2024-10-07 VITALS — BP 98/70 | HR 58 | Ht 68.0 in | Wt 242.8 lb

## 2024-10-07 DIAGNOSIS — Z79899 Other long term (current) drug therapy: Secondary | ICD-10-CM | POA: Diagnosis not present

## 2024-10-07 DIAGNOSIS — I4891 Unspecified atrial fibrillation: Secondary | ICD-10-CM | POA: Diagnosis not present

## 2024-10-07 DIAGNOSIS — Z5181 Encounter for therapeutic drug level monitoring: Secondary | ICD-10-CM | POA: Diagnosis not present

## 2024-10-07 DIAGNOSIS — I4819 Other persistent atrial fibrillation: Secondary | ICD-10-CM | POA: Diagnosis not present

## 2024-10-07 NOTE — Progress Notes (Signed)
 Primary Care Physician: Gretta Rosina DEL, NP Primary Cardiologist: Dr Wendel Primary Electrophysiologist: Dr Waddell Referring Physician: J[facility]   Russell [name] is a 48 y.o. male with a history of atrial flutter, atrial fibrillation, DVT, OSA who presents for follow up in the [facility].  The patient was initially diagnosed with atrial flutter in [place]  08/2017 and has had numerous cardioversions. Caffeine and Sprite appear to have been triggers for him. He has been on amiodarone in the past. Patient is not on long term anticoagulation with a CHADS2VASC score of 0. Patient has not followed up after his previous cardoversions due to a lack of insurance. He was seen in the ER on 03/02/20 with symptoms of palpitations and underwent successful DCCV. He was started on Xarelto . He denies any history of afib. He denies significant alcohol use. He has OSA but has not been on CPAP 2/2 cost.  Patient is s/p atrial flutter ablation with Dr Waddell on 04/17/20. He has subsequently developed atrial fibrillation and underwent DCCV in the ED 08/15/20. He felt he was back in afib with symptoms of palpitations and SOB on 08/17/20. He presented to the ED in rate controlled afib. He spontaneously converted in the waiting room. Patient had recurrent afib and is s/p DCCV 03/16/24.  Patient returns for follow up for atrial fibrillation and Multaq  monitoring. He reports that he has done well from an afib standpoint. He denies any interim symptoms of afib. He is currently being treated for cellulitis on his left leg. No bleeding issues on anticoagulation.   Today, he  denies symptoms of palpitations, chest pain, shortness of breath, orthopnea, PND, lower extremity edema, dizziness, presyncope, syncope, bleeding, or neurologic sequela. The patient is tolerating medications without difficulties and is otherwise without complaint today.    Atrial Fibrillation Risk Factors:  he does have  symptoms or diagnosis of sleep apnea. he does not have a history of rheumatic fever. he does have a history of alcohol use. The patient does not have a history of early familial atrial fibrillation or other arrhythmias.   Atrial Fibrillation Management history:  Previous antiarrhythmic drugs: amiodarone, Multaq  Previous cardioversions: several, most recently 03/02/20, 08/15/20, 03/16/24 Previous ablations: none Anticoagulation history: Xarelto    Past Medical History:  Diagnosis Date   Atrial fibrillation (HCC)    Atrial flutter (HCC)    Cellulitis     Current Outpatient Medications  Medication Sig Dispense Refill   cephALEXin  (KEFLEX ) 500 MG capsule Take 1 capsule (500 mg total) by mouth 4 (four) times a day for 7 days. 28 capsule 0   colchicine  0.6 MG tablet Take 1 tablet (0.6 mg total) by mouth 2 (two) times daily. 60 tablet 0   dronedarone  (MULTAQ ) 400 MG tablet Take 1 tablet (400 mg total) by mouth 2 (two) times daily with a meal. 60 tablet 3   Multiple Vitamin (ONE-A-DAY MENS PO) Take 1 tablet by mouth in the morning.     Omega-3 Fatty Acids (FISH OIL PO) Take 1 capsule by mouth daily.     ondansetron  (ZOFRAN -ODT) 4 MG disintegrating tablet Take 1 tablet (4 mg total) by mouth every 8 (eight) hours as needed for nausea or vomiting. 15 tablet 0   pantoprazole  (PROTONIX ) 40 MG tablet Take 1 tablet (40 mg total) by mouth every morning before breakfast. 30 tablet 2   pantoprazole  (PROTONIX ) 40 MG tablet Take 1 tablet (40 mg total) by mouth every morning. 90 tablet 1   rivaroxaban  (  XARELTO ) 20 MG TABS tablet Take 1 tablet (20 mg total) by mouth daily. 30 tablet 2   rivaroxaban  (XARELTO ) 20 MG TABS tablet Take 1 tablet (20 mg total) by mouth daily. 30 tablet 5   sertraline  (ZOLOFT ) 50 MG tablet Take 1.5 tablets (75 mg total) by mouth daily. 135 tablet 1   No current facility-administered medications for this encounter.    ROS- All systems are reviewed and negative except as per the  HPI above.  Physical Exam: Vitals:   10/07/24 0815  Height: 5' 8 (1.727 m)    GEN: Well nourished, well developed in no acute distress CARDIAC: Regular rate and rhythm with occasional ectopy, no murmurs, rubs, gallops RESPIRATORY:  Clear to auscultation without rales, wheezing or rhonchi  ABDOMEN: Soft, non-tender, non-distended EXTREMITIES:  No edema; No deformity    Wt Readings from Last 3 Encounters:  09/01/24 102.5 kg  05/26/24 117 kg  04/06/24 124.2 kg    EKG today demonstrates  SB, PACs Vent. rate 58 BPM PR interval 176 ms QRS duration 88 ms QT/QTcB 444/435 ms   Echo 12/19/23 demonstrates  1. Left ventricular ejection fraction, by estimation, is 40 to 45%. Left  ventricular ejection fraction by 2D MOD biplane is 42.8 %. The left  ventricle has mildly decreased function. The left ventricle demonstrates  global hypokinesis. Left ventricular diastolic function could not be evaluated.   2. Right ventricular systolic function is moderately reduced. The right  ventricular size is normal. There is normal pulmonary artery systolic  pressure.   3. The mitral valve is grossly normal. No evidence of mitral valve  regurgitation.   4. The aortic valve was not well visualized. Aortic valve regurgitation  is not visualized.   5. The inferior vena cava is dilated in size with <50% respiratory  variability, suggesting right atrial pressure of 15 mmHg.   Comparison(s): Changes from prior study are noted. 06/03/2022: LVEF 50-55%.    Epic records are reviewed at length today   CHA2DS2-VASc Score = 1  The patient's score is based upon: CHF History: 1 HTN History: 0 Diabetes History: 0 Stroke History: 0 Vascular Disease History: 0 Age Score: 0 Gender Score: 0       ASSESSMENT AND PLAN: Persistent Atrial Fibrillation/typical atrial flutter (ICD10:  I48.19) The patient's CHA2DS2-VASc score is 1, indicating a 0.6% annual risk of stroke.   S/p atrial flutter ablation  04/17/20 Patient appears to be maintaining SR. We discussed long term options for rhythm management. Given his significant weight loss, he is now interested in afib ablation in order to get off AAD, will refer to EP to discuss.  Continue Multaq  400 mg BID Continue Xarelto  20 mg daily (h/o unprovoked DVT)  High Risk Medication Monitoring (ICD 10: Z79.899) Patient requires ongoing monitoring for anti-arrhythmic medication which has the potential to cause life threatening arrhythmias. Intervals on ECG acceptable for dronedarone  monitoring.      Obesity Body mass index is 36.92 kg/m.  Encouraged lifestyle modification He has lost >100 lbs.   OSA  Patient not currently on CPAP  HFmrEF EF 45-50% GDMT per primary cardiology team Fluid status appears stable today   Follow up with EP to establish care, given Dr Adrian imminent retirement, and discuss ablation.    Daril Kicks PA-C [facility] [address] [place], [place] [address] [phone] 10/07/2024 8:19 AM

## 2024-10-12 ENCOUNTER — Other Ambulatory Visit: Payer: Self-pay

## 2024-10-12 ENCOUNTER — Other Ambulatory Visit (HOSPITAL_COMMUNITY): Payer: Self-pay

## 2024-10-15 ENCOUNTER — Other Ambulatory Visit (HOSPITAL_COMMUNITY): Payer: Self-pay

## 2024-10-15 MED ORDER — PANTOPRAZOLE SODIUM 40 MG PO TBEC
40.0000 mg | DELAYED_RELEASE_TABLET | ORAL | 2 refills | Status: AC
  Filled 2024-10-15 – 2024-11-23 (×4): qty 30, 30d supply, fill #0

## 2024-10-18 ENCOUNTER — Other Ambulatory Visit (HOSPITAL_COMMUNITY): Payer: Self-pay

## 2024-10-22 ENCOUNTER — Other Ambulatory Visit (HOSPITAL_COMMUNITY): Payer: Self-pay

## 2024-10-25 ENCOUNTER — Other Ambulatory Visit: Payer: Self-pay

## 2024-10-28 ENCOUNTER — Other Ambulatory Visit (HOSPITAL_BASED_OUTPATIENT_CLINIC_OR_DEPARTMENT_OTHER): Payer: Self-pay

## 2024-10-28 DIAGNOSIS — Z6836 Body mass index (BMI) 36.0-36.9, adult: Secondary | ICD-10-CM | POA: Diagnosis not present

## 2024-10-28 DIAGNOSIS — Z79899 Other long term (current) drug therapy: Secondary | ICD-10-CM | POA: Diagnosis not present

## 2024-10-28 DIAGNOSIS — K7581 Nonalcoholic steatohepatitis (NASH): Secondary | ICD-10-CM | POA: Diagnosis not present

## 2024-10-28 DIAGNOSIS — Z0000 Encounter for general adult medical examination without abnormal findings: Secondary | ICD-10-CM | POA: Diagnosis not present

## 2024-10-28 DIAGNOSIS — G4733 Obstructive sleep apnea (adult) (pediatric): Secondary | ICD-10-CM | POA: Diagnosis not present

## 2024-10-28 DIAGNOSIS — F331 Major depressive disorder, recurrent, moderate: Secondary | ICD-10-CM | POA: Diagnosis not present

## 2024-10-28 DIAGNOSIS — E66812 Obesity, class 2: Secondary | ICD-10-CM | POA: Diagnosis not present

## 2024-10-28 DIAGNOSIS — Z1330 Encounter for screening examination for mental health and behavioral disorders, unspecified: Secondary | ICD-10-CM | POA: Diagnosis not present

## 2024-10-28 DIAGNOSIS — E559 Vitamin D deficiency, unspecified: Secondary | ICD-10-CM | POA: Diagnosis not present

## 2024-10-28 DIAGNOSIS — Z23 Encounter for immunization: Secondary | ICD-10-CM | POA: Diagnosis not present

## 2024-10-28 DIAGNOSIS — I480 Paroxysmal atrial fibrillation: Secondary | ICD-10-CM | POA: Diagnosis not present

## 2024-10-28 MED ORDER — SERTRALINE HCL 100 MG PO TABS
100.0000 mg | ORAL_TABLET | Freq: Every day | ORAL | 1 refills | Status: AC
Start: 2024-10-28 — End: ?
  Filled 2024-10-28: qty 90, 90d supply, fill #0

## 2024-10-30 DIAGNOSIS — Z86718 Personal history of other venous thrombosis and embolism: Secondary | ICD-10-CM | POA: Diagnosis not present

## 2024-10-30 DIAGNOSIS — L03116 Cellulitis of left lower limb: Secondary | ICD-10-CM | POA: Diagnosis not present

## 2024-10-30 DIAGNOSIS — R509 Fever, unspecified: Secondary | ICD-10-CM | POA: Diagnosis not present

## 2024-10-30 DIAGNOSIS — R112 Nausea with vomiting, unspecified: Secondary | ICD-10-CM | POA: Diagnosis not present

## 2024-10-30 DIAGNOSIS — Z79899 Other long term (current) drug therapy: Secondary | ICD-10-CM | POA: Diagnosis not present

## 2024-10-30 DIAGNOSIS — Z87891 Personal history of nicotine dependence: Secondary | ICD-10-CM | POA: Diagnosis not present

## 2024-10-30 DIAGNOSIS — Z7901 Long term (current) use of anticoagulants: Secondary | ICD-10-CM | POA: Diagnosis not present

## 2024-10-30 DIAGNOSIS — L539 Erythematous condition, unspecified: Secondary | ICD-10-CM | POA: Diagnosis not present

## 2024-10-31 ENCOUNTER — Other Ambulatory Visit (HOSPITAL_BASED_OUTPATIENT_CLINIC_OR_DEPARTMENT_OTHER): Payer: Self-pay

## 2024-10-31 MED ORDER — CEPHALEXIN 500 MG PO CAPS
500.0000 mg | ORAL_CAPSULE | Freq: Three times a day (TID) | ORAL | 0 refills | Status: DC
  Filled 2024-10-31 – 2024-11-01 (×2): qty 30, 10d supply, fill #0

## 2024-11-01 ENCOUNTER — Other Ambulatory Visit (HOSPITAL_BASED_OUTPATIENT_CLINIC_OR_DEPARTMENT_OTHER): Payer: Self-pay

## 2024-11-03 DIAGNOSIS — Z7901 Long term (current) use of anticoagulants: Secondary | ICD-10-CM | POA: Diagnosis not present

## 2024-11-03 DIAGNOSIS — M79652 Pain in left thigh: Secondary | ICD-10-CM | POA: Diagnosis not present

## 2024-11-03 DIAGNOSIS — L0390 Cellulitis, unspecified: Secondary | ICD-10-CM | POA: Diagnosis not present

## 2024-11-11 ENCOUNTER — Other Ambulatory Visit (HOSPITAL_COMMUNITY): Payer: Self-pay | Admitting: Physician Assistant

## 2024-11-11 ENCOUNTER — Encounter: Payer: Self-pay | Admitting: Pharmacist

## 2024-11-11 ENCOUNTER — Other Ambulatory Visit: Payer: Self-pay

## 2024-11-11 MED ORDER — MULTAQ 400 MG PO TABS
400.0000 mg | ORAL_TABLET | Freq: Two times a day (BID) | ORAL | 3 refills | Status: AC
  Filled 2024-11-11 – 2024-11-22 (×3): qty 60, 30d supply, fill #0
  Filled 2024-12-18 – 2024-12-20 (×2): qty 60, 30d supply, fill #1
  Filled 2025-01-17: qty 60, 30d supply, fill #2

## 2024-11-12 ENCOUNTER — Other Ambulatory Visit: Payer: Self-pay

## 2024-11-17 ENCOUNTER — Other Ambulatory Visit: Payer: Self-pay

## 2024-11-17 ENCOUNTER — Other Ambulatory Visit (HOSPITAL_COMMUNITY): Payer: Self-pay

## 2024-11-19 ENCOUNTER — Other Ambulatory Visit: Payer: Self-pay

## 2024-11-22 ENCOUNTER — Other Ambulatory Visit: Payer: Self-pay

## 2024-11-22 ENCOUNTER — Other Ambulatory Visit (HOSPITAL_BASED_OUTPATIENT_CLINIC_OR_DEPARTMENT_OTHER): Payer: Self-pay

## 2024-11-23 ENCOUNTER — Other Ambulatory Visit (HOSPITAL_BASED_OUTPATIENT_CLINIC_OR_DEPARTMENT_OTHER): Payer: Self-pay

## 2024-11-23 ENCOUNTER — Other Ambulatory Visit (HOSPITAL_COMMUNITY): Payer: Self-pay

## 2024-11-23 ENCOUNTER — Other Ambulatory Visit: Payer: Self-pay

## 2024-11-24 ENCOUNTER — Other Ambulatory Visit (HOSPITAL_COMMUNITY): Payer: Self-pay

## 2024-12-07 ENCOUNTER — Other Ambulatory Visit (HOSPITAL_COMMUNITY): Payer: Self-pay

## 2024-12-07 ENCOUNTER — Other Ambulatory Visit (HOSPITAL_BASED_OUTPATIENT_CLINIC_OR_DEPARTMENT_OTHER): Payer: Self-pay

## 2024-12-07 DIAGNOSIS — L03116 Cellulitis of left lower limb: Secondary | ICD-10-CM | POA: Diagnosis not present

## 2024-12-07 DIAGNOSIS — Z20822 Contact with and (suspected) exposure to covid-19: Secondary | ICD-10-CM | POA: Diagnosis not present

## 2024-12-07 DIAGNOSIS — B349 Viral infection, unspecified: Secondary | ICD-10-CM | POA: Diagnosis not present

## 2024-12-07 MED ORDER — DOXYCYCLINE HYCLATE 100 MG PO TABS
100.0000 mg | ORAL_TABLET | Freq: Two times a day (BID) | ORAL | 0 refills | Status: AC
  Filled 2024-12-07: qty 14, 7d supply, fill #0

## 2024-12-09 ENCOUNTER — Ambulatory Visit: Admitting: Cardiovascular Disease

## 2024-12-18 ENCOUNTER — Other Ambulatory Visit (HOSPITAL_BASED_OUTPATIENT_CLINIC_OR_DEPARTMENT_OTHER): Payer: Self-pay

## 2024-12-19 ENCOUNTER — Other Ambulatory Visit (HOSPITAL_BASED_OUTPATIENT_CLINIC_OR_DEPARTMENT_OTHER): Payer: Self-pay

## 2024-12-19 MED ORDER — CEPHALEXIN 500 MG PO CAPS
500.0000 mg | ORAL_CAPSULE | Freq: Four times a day (QID) | ORAL | 0 refills | Status: DC
  Filled 2024-12-19: qty 28, 7d supply, fill #0

## 2024-12-20 ENCOUNTER — Other Ambulatory Visit: Payer: Self-pay

## 2024-12-20 ENCOUNTER — Other Ambulatory Visit (HOSPITAL_BASED_OUTPATIENT_CLINIC_OR_DEPARTMENT_OTHER): Payer: Self-pay

## 2024-12-20 MED ORDER — XARELTO 20 MG PO TABS
20.0000 mg | ORAL_TABLET | Freq: Every day | ORAL | 5 refills | Status: AC
  Filled 2024-12-20: qty 30, 30d supply, fill #0
  Filled 2025-01-17: qty 30, 30d supply, fill #1

## 2024-12-27 ENCOUNTER — Other Ambulatory Visit (HOSPITAL_BASED_OUTPATIENT_CLINIC_OR_DEPARTMENT_OTHER): Payer: Self-pay

## 2025-01-03 ENCOUNTER — Other Ambulatory Visit (HOSPITAL_BASED_OUTPATIENT_CLINIC_OR_DEPARTMENT_OTHER): Payer: Self-pay

## 2025-01-03 MED ORDER — CLINDAMYCIN HCL 300 MG PO CAPS
300.0000 mg | ORAL_CAPSULE | Freq: Three times a day (TID) | ORAL | 0 refills | Status: AC
  Filled 2025-01-03: qty 21, 7d supply, fill #0

## 2025-01-17 ENCOUNTER — Other Ambulatory Visit (HOSPITAL_BASED_OUTPATIENT_CLINIC_OR_DEPARTMENT_OTHER): Payer: Self-pay

## 2025-01-17 ENCOUNTER — Other Ambulatory Visit: Payer: Self-pay

## 2025-01-17 MED ORDER — CEPHALEXIN 500 MG PO CAPS
500.0000 mg | ORAL_CAPSULE | Freq: Four times a day (QID) | ORAL | 0 refills | Status: AC
  Filled 2025-01-17: qty 28, 7d supply, fill #0

## 2025-01-18 ENCOUNTER — Other Ambulatory Visit (HOSPITAL_BASED_OUTPATIENT_CLINIC_OR_DEPARTMENT_OTHER): Payer: Self-pay

## 2025-01-18 ENCOUNTER — Other Ambulatory Visit: Payer: Self-pay
